# Patient Record
Sex: Female | Born: 1995 | Race: Black or African American | Hispanic: No | Marital: Single | Smoking: Never smoker
Health system: Southern US, Community
[De-identification: ages and names within clinical notes are randomized; demographics above are authoritative.]

## PROBLEM LIST (undated history)

## (undated) ENCOUNTER — Inpatient Hospital Stay (HOSPITAL_COMMUNITY): Payer: Self-pay

## (undated) DIAGNOSIS — Z8679 Personal history of other diseases of the circulatory system: Secondary | ICD-10-CM

## (undated) DIAGNOSIS — Z789 Other specified health status: Secondary | ICD-10-CM

## (undated) DIAGNOSIS — N390 Urinary tract infection, site not specified: Secondary | ICD-10-CM

## (undated) HISTORY — PX: TONSILLECTOMY: SUR1361

---

## 2015-11-21 ENCOUNTER — Encounter (HOSPITAL_COMMUNITY): Payer: Self-pay

## 2015-11-21 ENCOUNTER — Emergency Department (HOSPITAL_COMMUNITY): Payer: Managed Care, Other (non HMO)

## 2015-11-21 ENCOUNTER — Emergency Department (HOSPITAL_COMMUNITY)
Admission: EM | Admit: 2015-11-21 | Discharge: 2015-11-21 | Disposition: A | Discharge status: Home / Self Care | Payer: Managed Care, Other (non HMO) | Attending: Emergency Medicine | Admitting: Emergency Medicine

## 2015-11-21 DIAGNOSIS — Y92481 Parking lot as the place of occurrence of the external cause: Secondary | ICD-10-CM | POA: Diagnosis not present

## 2015-11-21 DIAGNOSIS — F17210 Nicotine dependence, cigarettes, uncomplicated: Secondary | ICD-10-CM | POA: Diagnosis not present

## 2015-11-21 DIAGNOSIS — Y9302 Activity, running: Secondary | ICD-10-CM | POA: Insufficient documentation

## 2015-11-21 DIAGNOSIS — Z23 Encounter for immunization: Secondary | ICD-10-CM | POA: Diagnosis not present

## 2015-11-21 DIAGNOSIS — W228XXA Striking against or struck by other objects, initial encounter: Secondary | ICD-10-CM | POA: Diagnosis not present

## 2015-11-21 DIAGNOSIS — S99922A Unspecified injury of left foot, initial encounter: Secondary | ICD-10-CM | POA: Diagnosis present

## 2015-11-21 DIAGNOSIS — T148XXA Other injury of unspecified body region, initial encounter: Secondary | ICD-10-CM

## 2015-11-21 DIAGNOSIS — S91115A Laceration without foreign body of left lesser toe(s) without damage to nail, initial encounter: Secondary | ICD-10-CM | POA: Diagnosis not present

## 2015-11-21 DIAGNOSIS — Y999 Unspecified external cause status: Secondary | ICD-10-CM | POA: Insufficient documentation

## 2015-11-21 MED ORDER — CIPROFLOXACIN HCL 500 MG PO TABS
500.0000 mg | ORAL_TABLET | Freq: Two times a day (BID) | ORAL | 0 refills | Status: DC
Start: 2015-11-21 — End: 2017-01-25

## 2015-11-21 MED ORDER — CIPROFLOXACIN HCL 500 MG PO TABS
500.0000 mg | ORAL_TABLET | Freq: Once | ORAL | Status: AC
  Administered 2015-11-21: 500 mg via ORAL
  Filled 2015-11-21: qty 1

## 2015-11-21 MED ORDER — TETANUS-DIPHTH-ACELL PERTUSSIS 5-2.5-18.5 LF-MCG/0.5 IM SUSP
0.5000 mL | Freq: Once | INTRAMUSCULAR | Status: AC
  Administered 2015-11-21: 0.5 mL via INTRAMUSCULAR
  Filled 2015-11-21: qty 0.5
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
## 2016-06-04 ENCOUNTER — Emergency Department (HOSPITAL_COMMUNITY): Payer: Medicaid Other

## 2016-06-04 ENCOUNTER — Encounter (HOSPITAL_COMMUNITY): Payer: Self-pay | Admitting: *Deleted

## 2016-06-04 ENCOUNTER — Emergency Department (HOSPITAL_COMMUNITY)
Admission: EM | Admit: 2016-06-04 | Discharge: 2016-06-04 | Disposition: A | Discharge status: Home / Self Care | Payer: Medicaid Other | Attending: Emergency Medicine | Admitting: Emergency Medicine

## 2016-06-04 DIAGNOSIS — F17210 Nicotine dependence, cigarettes, uncomplicated: Secondary | ICD-10-CM | POA: Diagnosis not present

## 2016-06-04 DIAGNOSIS — O99331 Smoking (tobacco) complicating pregnancy, first trimester: Secondary | ICD-10-CM | POA: Insufficient documentation

## 2016-06-04 DIAGNOSIS — O26891 Other specified pregnancy related conditions, first trimester: Secondary | ICD-10-CM | POA: Diagnosis present

## 2016-06-04 DIAGNOSIS — Z3A01 Less than 8 weeks gestation of pregnancy: Secondary | ICD-10-CM

## 2016-06-04 DIAGNOSIS — Z79899 Other long term (current) drug therapy: Secondary | ICD-10-CM | POA: Insufficient documentation

## 2016-06-04 DIAGNOSIS — R1011 Right upper quadrant pain: Secondary | ICD-10-CM

## 2016-06-04 LAB — CBC
HCT: 38.3 % (ref 36.0–46.0)
HEMOGLOBIN: 12.4 g/dL (ref 12.0–15.0)
MCH: 26.7 pg (ref 26.0–34.0)
MCHC: 32.4 g/dL (ref 30.0–36.0)
MCV: 82.5 fL (ref 78.0–100.0)
PLATELETS: 296 10*3/uL (ref 150–400)
RBC: 4.64 MIL/uL (ref 3.87–5.11)
RDW: 14.1 % (ref 11.5–15.5)
WBC: 7.5 10*3/uL (ref 4.0–10.5)

## 2016-06-04 LAB — URINALYSIS, ROUTINE W REFLEX MICROSCOPIC
Bilirubin Urine: NEGATIVE
GLUCOSE, UA: NEGATIVE mg/dL
Ketones, ur: NEGATIVE mg/dL
Leukocytes, UA: NEGATIVE
NITRITE: NEGATIVE
PH: 5 (ref 5.0–8.0)
PROTEIN: NEGATIVE mg/dL
SPECIFIC GRAVITY, URINE: 1.026 (ref 1.005–1.030)

## 2016-06-04 LAB — I-STAT BETA HCG BLOOD, ED (MC, WL, AP ONLY): I-stat hCG, quantitative: >2000 m[IU]/mL — ABNORMAL HIGH (ref <5)

## 2016-06-04 LAB — COMPREHENSIVE METABOLIC PANEL
ALBUMIN: 3.9 g/dL (ref 3.5–5.0)
ALK PHOS: 52 U/L (ref 38–126)
ALT: 14 U/L (ref 14–54)
ANION GAP: 7 (ref 5–15)
AST: 21 U/L (ref 15–41)
BILIRUBIN TOTAL: 0.8 mg/dL (ref 0.3–1.2)
BUN: 7 mg/dL (ref 6–20)
CALCIUM: 9.2 mg/dL (ref 8.9–10.3)
CO2: 24 mmol/L (ref 22–32)
CREATININE: 0.56 mg/dL (ref 0.44–1.00)
Chloride: 102 mmol/L (ref 101–111)
GFR calc Af Amer: >60 mL/min (ref >60)
GFR calc non Af Amer: >60 mL/min (ref >60)
GLUCOSE: 90 mg/dL (ref 65–99)
Potassium: 3.7 mmol/L (ref 3.5–5.1)
SODIUM: 133 mmol/L — AB (ref 135–145)
TOTAL PROTEIN: 7.7 g/dL (ref 6.5–8.1)

## 2016-06-04 LAB — LIPASE, BLOOD: Lipase: 17 U/L (ref 11–51)

## 2016-06-04 MED ORDER — CEPHALEXIN 500 MG PO CAPS: 500.0000 mg | ORAL_CAPSULE | Freq: Two times a day (BID) | ORAL | 0 refills | Status: DC

## 2016-06-04 NOTE — ED Triage Notes (Signed)
Pt reports having positive preg test on Thursday. Having lower abd cramping since before finding out she is pregnant. Denies vaginal bleeding. Reports freq bowel movements and lower back pain.

## 2016-06-04 NOTE — Discharge Instructions (Signed)
You are  6 weeks and 3 days pregnant. Baby has normal heart rate. Your ultrasound of your gallbladder was normal. Please admit fatty and spicy foods. Have given you a gastrointestinal follow-up if symptoms persist. Have given you follow-up to the women's clinic please call tomorrow for an appointment as soon as possible. Continue your prenatal vitamins. Your urine does have a small amount of blood in it. And discharged on antibiotics. Have cultured her urine and if it grows back anything abnormal will be notified. If you develop any cramping, vaginal bleeding, vaginal discharge, fevers, nausea, vomiting please return to the ED immediately or go to the women's ER.

## 2016-06-04 NOTE — ED Notes (Signed)
PT did not have to use RR at this time  

## 2016-06-04 NOTE — ED Notes (Signed)
Pt stable, ambulatory, states understanding of discharge instructions 

## 2016-06-05 NOTE — ED Provider Notes (Signed)
[facility] Provider Note   CSN: [id number] Arrival date & time: 06/04/16  1422     History   Chief Complaint Chief Complaint  Patient presents with  . Abdominal Pain  . Back Pain    HPI Jodi York is a 21 y.o. female.  HPI 21 year old African American female with no significant past medical history presents to the ED today with complaints of right upper quadrant abdominal pain and positive pregnancy test last week. Patient states that she's been no right upper quadrant abdominal pain for the past month. The pain is intermittent and associated when she eats with food. Nothing makes better or worse. She has not tried anything for the pain. Pain self resolved. Denies any nausea, vomiting, diarrhea. She denies any fever or history of abdominal surgeries. The patient states that her bowel movements are regular and she denies any constipation, hematochezia, melena. Patient also states that last week she took a pregnancy test and it was positive. States that her last menstrual period was 04/17/2016. This is her first pregnancy. She complains of low back pain and mild lower abdominal cramping since she thought she was pregnant. She denies any vaginal bleeding. Patient denies any urinary symptoms, fever, chills, lightheadedness, dizziness, chest pain, shortness of breath, urinary retention, loss of bowel or bladder, saddle paresthesias, lower extremity paresthesias. Denies having OB/GYN. States she did start taking prenatal vitamins History reviewed. No pertinent past medical history.  There are no active problems to display for this patient.   History reviewed. No pertinent surgical history.  OB History    Gravida Para Term Preterm AB Living   1             SAB TAB Ectopic Multiple Live Births                   Home Medications    Prior to Admission medications   Medication Sig Start Date End Date Taking? Authorizing Provider  diphenhydrAMINE (BENADRYL) 25 mg capsule Take 25 mg  by mouth every 6 (six) hours as needed for allergies.   Yes Historical Provider, MD  Prenatal Vit-Fe Fumarate-FA (PRENATAL MULTIVITAMIN) TABS tablet Take 1 tablet by mouth 2 (two) times daily.   Yes Historical Provider, MD  cephALEXin (KEFLEX) 500 MG capsule Take 1 capsule (500 mg total) by mouth 2 (two) times daily. 06/04/16   Rise Mu, PA-C  ciprofloxacin (CIPRO) 500 MG tablet Take 1 tablet (500 mg total) by mouth 2 (two) times daily. Patient not taking: Reported on 06/04/2016 11/21/15   April Palumbo, MD    Family History History reviewed. No pertinent family history.  Social History Social History  Substance Use Topics  . Smoking status: Current Every Day Smoker    Types: Cigarettes  . Smokeless tobacco: Current User  . Alcohol use Yes     Allergies   Shellfish allergy; Penicillins; and Pollen extract   Review of Systems Review of Systems  Constitutional: Negative for chills and fever.  HENT: Negative for congestion.   Eyes: Negative for visual disturbance.  Respiratory: Negative for cough and shortness of breath.   Cardiovascular: Negative for chest pain.  Gastrointestinal: Positive for abdominal pain. Negative for constipation, diarrhea, nausea and vomiting.  Genitourinary: Negative for dysuria, flank pain, frequency, hematuria, pelvic pain, vaginal bleeding, vaginal discharge and vaginal pain.  Musculoskeletal: Positive for back pain.  Skin: Negative.   Neurological: Negative for dizziness, syncope, weakness, light-headedness, numbness and headaches.     Physical Exam Updated Vital Signs  BP (!) 101/58 (BP Location: Right Arm)   Pulse 91   Temp 98.7 F (37.1 C) (Oral)   Resp 16   LMP 04/17/2016   SpO2 100%   Physical Exam  Constitutional: She is oriented to person, place, and time. She appears well-developed and well-nourished. No distress.  Nontoxic appearing. Resting comfortable on the bed.  HENT:  Head: Normocephalic and atraumatic.  Mouth/Throat:  Oropharynx is clear and moist.  Eyes: Conjunctivae and EOM are normal. Pupils are equal, round, and reactive to light. Right eye exhibits no discharge. Left eye exhibits no discharge. No scleral icterus.  Neck: Normal range of motion. Neck supple. No thyromegaly present.  Cardiovascular: Normal rate, regular rhythm, normal heart sounds and intact distal pulses.  Exam reveals no gallop and no friction rub.   No murmur heard. Pulmonary/Chest: Effort normal and breath sounds normal.  Abdominal: Soft. Bowel sounds are normal. She exhibits no distension. There is no tenderness. There is no rigidity, no rebound, no guarding and no CVA tenderness.  No CVA tenderness.  Genitourinary:  Genitourinary Comments: Chaperone present for exam. Patient tolerated exam without difficulties. Cervical os is closed. No vaginal bleeding, vaginal discharge, vaginal erythema noted. Cervix is firm. No cervical motion tenderness, adnexal tenderness or fullness.  Musculoskeletal: Normal range of motion.  Lymphadenopathy:    She has no cervical adenopathy.  Neurological: She is alert and oriented to person, place, and time.  Skin: Skin is warm and dry. Capillary refill takes less than 2 seconds.  Nursing note and vitals reviewed.    ED Treatments / Results  Labs (all labs ordered are listed, but only abnormal results are displayed) Labs Reviewed  COMPREHENSIVE METABOLIC PANEL - Abnormal; Notable for the following:       Result Value   Sodium 133 (*)    All other components within normal limits  URINALYSIS, ROUTINE W REFLEX MICROSCOPIC - Abnormal; Notable for the following:    Hgb urine dipstick LARGE (*)    Bacteria, UA RARE (*)    Squamous Epithelial / LPF 0-5 (*)    All other components within normal limits  I-STAT BETA HCG BLOOD, ED ([facility], [facility], [facility] ONLY) - Abnormal; Notable for the following:    I-stat hCG, quantitative >2,000.0 (*)    All other components within normal limits  URINE CULTURE  LIPASE, BLOOD    CBC    EKG  EKG Interpretation None       Radiology US Ob Comp < 14 Wks  Result Date: 06/04/2016 CLINICAL DATA:  Vaginal bleeding. Pregnant with unknown last menstrual period. Quantitative beta HCG greater than 2000. EXAM: OBSTETRIC <14 WK [place] AND TRANSVAGINAL OB US TECHNIQUE: Both transabdominal and transvaginal ultrasound examinations were performed for complete evaluation of the gestation as well as the maternal uterus, adnexal regions, and pelvic cul-de-sac. Transvaginal technique was performed to assess early pregnancy. COMPARISON:  None. FINDINGS: Intrauterine gestational sac: Visualized Yolk sac:  Visualized Embryo:  Visualized Cardiac Activity: Visualized Heart Rate: 171  bpm CRL:  6.2  mm   6 w   3 d                  [place] EDC: 01/25/2017. Subchorionic hemorrhage:  None visualized. Maternal uterus/adnexae: Normal appearing right ovary containing a corpus luteum. Nonvisualized left ovary. No free peritoneal fluid. IMPRESSION: Single live intrauterine gestation with an estimated gestational age of [redacted] weeks and 3 days. No complicating features. Nonvisualized maternal left ovary. Electronically Signed   By: Beckie Salts  M.D.   On: 06/04/2016 19:05   US Ob Transvaginal  Result Date: 06/04/2016 CLINICAL DATA:  Vaginal bleeding. Pregnant with unknown last menstrual period. Quantitative beta HCG greater than 2000. EXAM: OBSTETRIC <14 WK [place] AND TRANSVAGINAL OB US TECHNIQUE: Both transabdominal and transvaginal ultrasound examinations were performed for complete evaluation of the gestation as well as the maternal uterus, adnexal regions, and pelvic cul-de-sac. Transvaginal technique was performed to assess early pregnancy. COMPARISON:  None. FINDINGS: Intrauterine gestational sac: Visualized Yolk sac:  Visualized Embryo:  Visualized Cardiac Activity: Visualized Heart Rate: 171  bpm CRL:  6.2  mm   6 w   3 d                  [place] EDC: 01/25/2017. Subchorionic hemorrhage:  None visualized. Maternal  uterus/adnexae: Normal appearing right ovary containing a corpus luteum. Nonvisualized left ovary. No free peritoneal fluid. IMPRESSION: Single live intrauterine gestation with an estimated gestational age of [redacted] weeks and 3 days. No complicating features. Nonvisualized maternal left ovary. Electronically Signed   By: Beckie Salts M.D.   On: 06/04/2016 19:05   US Abdomen Limited Ruq  Result Date: 06/04/2016 CLINICAL DATA:  Right upper quadrant abdominal pain for the past 2 weeks. EXAM: US ABDOMEN LIMITED - RIGHT UPPER QUADRANT COMPARISON:  None. FINDINGS: Gallbladder: No gallstones or wall thickening visualized. No sonographic Murphy sign noted by sonographer. Common bile duct: Diameter: 2.4 mm Liver: No focal lesion identified. Within normal limits in parenchymal echogenicity. IMPRESSION: Normal examination. Electronically Signed   By: Beckie Salts M.D.   On: 06/04/2016 17:49    Procedures Procedures (including critical care time)  Medications Ordered in ED Medications - No data to display   Initial Impression / Assessment and Plan / ED Course  I have reviewed the triage vital signs and the nursing notes.  Pertinent labs & imaging results that were available during my care of the patient were reviewed by me and considered in my medical decision making (see chart for details).     Patient presents to the ED with complaints of right upper quadrant abdominal pain ongoing for the past month along with a positive pregnancy test last week. Labs are reassuring. No leukocytosis. Electrolytes are normal. Lipase is normal. Patient without any focal abdominal tenderness on exam. No signs of peritonitis. No CVA tenderness. Patient denies any urinary symptoms. UA with large amount of hemoglobin rare bacteria. We'll send for culture. Doubt UTI or [place] however given these findings and the patient's pregnancy we'll start antibiotics. Patient states she does have a penicillin allergy but takes amoxicillin already  difficulties. Discussed with pharmacy will start patient on Keflex. I-STAT beta hCG was greater than 2000. Patient denies any vaginal bleeding. Pelvic exam revealed a closed cervical os without any vaginal bleeding. No cervical motion tenderness. Doubt PID.  Vaginal ultrasound revealed a single live intrauterine gestation with estimated gestational age of [redacted] weeks and 3 days. No Pitting features. Ultrasound right upper quadrant was ordered that showed no cholelithiasis or cholecystitis. No other abnormalities. Doubt cholecystitis. Symptoms consistent with biliary colic. Encourage patient to follow up with GI doctor. Have given patient referral to OB/GYN. The patient is currently taking multivitamin. Vital signs are stable. Low back pain likely due to pregnancy changes. Patient was discussed with Dr. Deretha Emory who is agreeable with the above plan. Pt is hemodynamically stable, in NAD, & able to ambulate in the ED. Pain has been managed & has no complaints prior to dc. Pt  is comfortable with above plan and is stable for discharge at this time. All questions were answered prior to disposition. Strict return precautions for f/u to the ED were discussed.   Final Clinical Impressions(s) / ED Diagnoses   Final diagnoses:  RUQ pain  Less than [redacted] weeks gestation of pregnancy    New Prescriptions Discharge Medication List as of 06/04/2016  8:01 PM    START taking these medications   Details  cephALEXin (KEFLEX) 500 MG capsule Take 1 capsule (500 mg total) by mouth 2 (two) times daily., Starting Sun 06/04/2016, Print         Rise Mu, PA-C 06/05/16 0130    Vanetta Mulders, MD 06/07/16 [phone]

## 2016-06-06 LAB — URINE CULTURE: Culture: NO GROWTH

## 2016-07-17 LAB — OB RESULTS CONSOLE ANTIBODY SCREEN: Antibody Screen: NEGATIVE

## 2016-07-17 LAB — OB RESULTS CONSOLE RPR: RPR: NONREACTIVE

## 2016-07-17 LAB — OB RESULTS CONSOLE ABO/RH: RH Type: POSITIVE

## 2016-07-17 LAB — OB RESULTS CONSOLE HEPATITIS B SURFACE ANTIGEN: Hepatitis B Surface Ag: NEGATIVE

## 2016-07-17 LAB — OB RESULTS CONSOLE RUBELLA ANTIBODY, IGM: RUBELLA: IMMUNE

## 2016-07-17 LAB — OB RESULTS CONSOLE HIV ANTIBODY (ROUTINE TESTING): HIV: NONREACTIVE

## 2016-10-09 ENCOUNTER — Encounter (HOSPITAL_COMMUNITY): Payer: Self-pay | Admitting: *Deleted

## 2016-10-09 ENCOUNTER — Emergency Department (HOSPITAL_COMMUNITY): Payer: Medicaid Other

## 2016-10-09 ENCOUNTER — Emergency Department (HOSPITAL_COMMUNITY)
Admission: EM | Admit: 2016-10-09 | Discharge: 2016-10-09 | Disposition: A | Discharge status: Home / Self Care | Payer: Medicaid Other | Attending: Emergency Medicine | Admitting: Emergency Medicine

## 2016-10-09 DIAGNOSIS — O9989 Other specified diseases and conditions complicating pregnancy, childbirth and the puerperium: Secondary | ICD-10-CM | POA: Diagnosis not present

## 2016-10-09 DIAGNOSIS — F17210 Nicotine dependence, cigarettes, uncomplicated: Secondary | ICD-10-CM | POA: Insufficient documentation

## 2016-10-09 DIAGNOSIS — Z79899 Other long term (current) drug therapy: Secondary | ICD-10-CM | POA: Insufficient documentation

## 2016-10-09 DIAGNOSIS — O99332 Smoking (tobacco) complicating pregnancy, second trimester: Secondary | ICD-10-CM | POA: Diagnosis not present

## 2016-10-09 DIAGNOSIS — R0789 Other chest pain: Secondary | ICD-10-CM | POA: Diagnosis not present

## 2016-10-09 DIAGNOSIS — Z3A24 24 weeks gestation of pregnancy: Secondary | ICD-10-CM | POA: Insufficient documentation

## 2016-10-09 LAB — I-STAT TROPONIN, ED: Troponin i, poc: 0 ng/mL (ref 0.00–0.08)

## 2016-10-09 MED ORDER — ACETAMINOPHEN 500 MG PO TABS: 500.0000 mg | ORAL_TABLET | Freq: Four times a day (QID) | ORAL | 0 refills | Status: DC | PRN

## 2016-10-09 MED ORDER — ACETAMINOPHEN 325 MG PO TABS
650.0000 mg | ORAL_TABLET | Freq: Once | ORAL | Status: AC
  Administered 2016-10-09: 650 mg via ORAL
  Filled 2016-10-09: qty 2

## 2016-10-09 NOTE — ED Provider Notes (Signed)
[facility] Provider Note   CSN: [id number] Arrival date & time: 10/09/16  0117     History   Chief Complaint Chief Complaint  Patient presents with  . Chest Pain    HPI Jodi York is a 21 y.o. female.  HPI  This a 21 year old female currently [redacted] weeks pregnant who presents with two-week history of chest pain. Patient reports anterior pressure-like pain. It is worse when she lies flat at night or leans forward.current pain is 7 out of 10. She denies shortness of breath or fevers. She denies leg swelling or pain. Denies any worsening of pain with food. She has not taken anything for her pain.  She currently has no pregnancy complaints. Reports good fetal movement. No vaginal discharge or bleeding.  History reviewed. No pertinent past medical history.  There are no active problems to display for this patient.   History reviewed. No pertinent surgical history.  OB History    Gravida Para Term Preterm AB Living   1             SAB TAB Ectopic Multiple Live Births                   Home Medications    Prior to Admission medications   Medication Sig Start Date End Date Taking? Authorizing Provider  Prenatal Vit-Fe Fumarate-FA (PRENATAL MULTIVITAMIN) TABS tablet Take 1 tablet by mouth 2 (two) times daily.   Yes [provider]  acetaminophen (TYLENOL) 500 MG tablet Take 1 tablet (500 mg total) by mouth every 6 (six) hours as needed. 10/09/16   Mahrosh Donnell, Mayer Masker, MD  cephALEXin (KEFLEX) 500 MG capsule Take 1 capsule (500 mg total) by mouth 2 (two) times daily. Patient not taking: Reported on 10/09/2016 06/04/16   Demetrios Loll T, PA-C  ciprofloxacin (CIPRO) 500 MG tablet Take 1 tablet (500 mg total) by mouth 2 (two) times daily. Patient not taking: Reported on 06/04/2016 11/21/15   Nicanor Alcon, April, MD    Family History No family history on file.  Social History Social History  Substance Use Topics  . Smoking status: Current Every Day Smoker    Types:  Cigarettes  . Smokeless tobacco: Current User  . Alcohol use Yes     Allergies   Shellfish allergy; Penicillins; and Pollen extract   Review of Systems Review of Systems  Constitutional: Negative for fever.  Respiratory: Negative for cough and shortness of breath.   Cardiovascular: Positive for chest pain. Negative for palpitations and leg swelling.  Gastrointestinal: Negative for abdominal pain.  Genitourinary: Negative for vaginal bleeding and vaginal discharge.  All other systems reviewed and are negative.    Physical Exam Updated Vital Signs BP 125/76   Pulse 69   Temp 98.4 F (36.9 C) (Oral)   Resp 17   Ht 5\' 6"  (1.676 m)   Wt 96.6 kg (213 lb)   LMP 04/17/2016   SpO2 100%   BMI 34.38 kg/m   Physical Exam  Constitutional: She is oriented to person, place, and time. She appears well-developed and well-nourished. No distress.  HENT:  Head: Normocephalic and atraumatic.  Cardiovascular: Normal rate and regular rhythm.   Murmur heard. Pulmonary/Chest: Effort normal and breath sounds normal. No respiratory distress. She has no wheezes. She exhibits no tenderness.  Abdominal: Soft. Bowel sounds are normal. There is no tenderness. There is no guarding.  Gravid above the umbilicus  Musculoskeletal:  No calf swelling or tenderness noted  Neurological: She is alert and  oriented to person, place, and time.  Skin: Skin is warm and dry.  Psychiatric: She has a normal mood and affect.  Nursing note and vitals reviewed.    ED Treatments / Results  Labs (all labs ordered are listed, but only abnormal results are displayed) Labs Reviewed  I-STAT TROPONIN, ED    EKG  EKG Interpretation  Date/Time:  Monday October 09 2016 01:26:24 EDT Ventricular Rate:  81 PR Interval:  142 QRS Duration: 72 QT Interval:  348 QTC Calculation: 404 R Axis:   34 Text Interpretation:  Normal sinus rhythm Normal ECG No old tracing to compare Confirmed by Rochele Raring [phone]) on  10/09/2016 1:29:52 AM       Radiology Dg Chest 2 View  Result Date: 10/09/2016 CLINICAL DATA:  Chest pain. EXAM: CHEST  2 VIEW COMPARISON:  None. FINDINGS: The cardiomediastinal contours are normal. The lungs are clear. Pulmonary vasculature is normal. No consolidation, pleural effusion, or pneumothorax. No acute osseous abnormalities are seen. IMPRESSION: Unremarkable radiographs the chest. Electronically Signed   By: Rubye Oaks M.D.   On: 10/09/2016 04:20    Procedures Procedures (including critical care time)  Medications Ordered in ED Medications  acetaminophen (TYLENOL) tablet 650 mg (650 mg Oral Given 10/09/16 0404)     Initial Impression / Assessment and Plan / ED Course  I have reviewed the triage vital signs and the nursing notes.  Pertinent labs & imaging results that were available during my care of the patient were reviewed by me and considered in my medical decision making (see chart for details).     Patient presents with chest pain. Ongoing for the last 2 weeks. Somewhat positional in nature. Not reproducible on exam. Vital signs notable initially for blood pressure of 136/89. Improved with position change to 115/79. Troponin, EKG, chest x-ray reassuring. While PE is certainly a consideration, patient's O2 sats 100% and heart rate is 69. She has no leg swelling or tenderness.  Would not pursue PE workup at this time. Patient improved with Tylenol. Discussed the workup and differential with the patient.  Patient has AV follow-up on Wednesday. Recommend calling OB later this morning for blood pressure recheck.  Patient was also given precautions regarding worsening of symptoms.  After history, exam, and medical workup I feel the patient has been appropriately medically screened and is safe for discharge home. Pertinent diagnoses were discussed with the patient. Patient was given return precautions.   Final Clinical Impressions(s) / ED Diagnoses   Final diagnoses:    Atypical chest pain    New Prescriptions New Prescriptions   ACETAMINOPHEN (TYLENOL) 500 MG TABLET    Take 1 tablet (500 mg total) by mouth every 6 (six) hours as needed.     Shon Baton, MD 10/09/16 [phone]

## 2016-10-09 NOTE — Discharge Instructions (Signed)
You're seen today for chest pain.Your EKG, troponin, and x-ray are reassuring.  Follow-up with your OB as planned on Wednesday. Take Tylenol as needed for pain. If you develop worsening pain, shortness of breath,increasing heart rate or any new or worsening symptoms she should be reevaluated immediately.  You need to call your OB later this morning for blood pressure recheck. You had one isolated blood pressure that was elevated. This improved with position changes.

## 2016-10-09 NOTE — ED Notes (Signed)
Pt transported to xray 

## 2016-10-09 NOTE — ED Triage Notes (Signed)
The pt is c/o chest pain whenn she lies down at night for 2 weeks.    She is [redacted] weeks pregnant edc dec

## 2016-12-06 LAB — OB RESULTS CONSOLE GC/CHLAMYDIA
Chlamydia: NEGATIVE
Gonorrhea: NEGATIVE

## 2017-01-01 LAB — OB RESULTS CONSOLE GBS: STREP GROUP B AG: NEGATIVE

## 2017-01-25 ENCOUNTER — Other Ambulatory Visit: Payer: Self-pay

## 2017-01-25 ENCOUNTER — Inpatient Hospital Stay (HOSPITAL_COMMUNITY)
Admission: AD | Admit: 2017-01-25 | Discharge: 2017-01-25 | Disposition: A | Discharge status: Home / Self Care | Payer: Medicaid Other | Source: Ambulatory Visit | Attending: Family Medicine | Admitting: Family Medicine

## 2017-01-25 ENCOUNTER — Telehealth (HOSPITAL_COMMUNITY): Payer: Self-pay | Admitting: *Deleted

## 2017-01-25 ENCOUNTER — Encounter (HOSPITAL_COMMUNITY): Payer: Self-pay | Admitting: *Deleted

## 2017-01-25 DIAGNOSIS — Z3A40 40 weeks gestation of pregnancy: Secondary | ICD-10-CM | POA: Insufficient documentation

## 2017-01-25 DIAGNOSIS — O479 False labor, unspecified: Secondary | ICD-10-CM

## 2017-01-25 DIAGNOSIS — O26893 Other specified pregnancy related conditions, third trimester: Secondary | ICD-10-CM | POA: Insufficient documentation

## 2017-01-25 HISTORY — DX: Other specified health status: Z78.9

## 2017-01-25 NOTE — Discharge Instructions (Signed)
Braxton Hicks Contractions °Contractions of the uterus can occur throughout pregnancy, but they are not always a sign that you are in labor. You may have practice contractions called Braxton Hicks contractions. These false labor contractions are sometimes confused with true labor. °What are Braxton Hicks contractions? °Braxton Hicks contractions are tightening movements that occur in the muscles of the uterus before labor. Unlike true labor contractions, these contractions do not result in opening (dilation) and thinning of the cervix. Toward the end of pregnancy (32-34 weeks), Braxton Hicks contractions can happen more often and may become stronger. These contractions are sometimes difficult to tell apart from true labor because they can be very uncomfortable. You should not feel embarrassed if you go to the hospital with false labor. °Sometimes, the only way to tell if you are in true labor is for your health care provider to look for changes in the cervix. The health care provider will do a physical exam and may monitor your contractions. If you are not in true labor, the exam should show that your cervix is not dilating and your water has not broken. °If there are no prenatal problems or other health problems associated with your pregnancy, it is completely safe for you to be sent home with false labor. You may continue to have Braxton Hicks contractions until you go into true labor. °How can I tell the difference between true labor and false labor? °· Differences °? False labor °? Contractions last 30-70 seconds.: Contractions are usually shorter and not as strong as true labor contractions. °? Contractions become very regular.: Contractions are usually irregular. °? Discomfort is usually felt in the top of the uterus, and it spreads to the lower abdomen and low back.: Contractions are often felt in the front of the lower abdomen and in the groin. °? Contractions do not go away with walking.: Contractions may  go away when you walk around or change positions while lying down. °? Contractions usually become more intense and increase in frequency.: Contractions get weaker and are shorter-lasting as time goes on. °? The cervix dilates and gets thinner.: The cervix usually does not dilate or become thin. °Follow these instructions at home: °· Take over-the-counter and prescription medicines only as told by your health care provider. °· Keep up with your usual exercises and follow other instructions from your health care provider. °· Eat and drink lightly if you think you are going into labor. °· If Braxton Hicks contractions are making you uncomfortable: °? Change your position from lying down or resting to walking, or change from walking to resting. °? Sit and rest in a tub of warm water. °? Drink enough fluid to keep your urine clear or pale yellow. Dehydration may cause these contractions. °? Do slow and deep breathing several times an hour. °· Keep all follow-up prenatal visits as told by your health care provider. This is important. °Contact a health care provider if: °· You have a fever. °· You have continuous pain in your abdomen. °Get help right away if: °· Your contractions become stronger, more regular, and closer together. °· You have fluid leaking or gushing from your vagina. °· You pass blood-tinged mucus (bloody show). °· You have bleeding from your vagina. °· You have low back pain that you never had before. °· You feel your baby’s head pushing down and causing pelvic pressure. °· Your baby is not moving inside you as much as it used to. °Summary °· Contractions that occur before labor are   called Braxton Hicks contractions, false labor, or practice contractions. °· Braxton Hicks contractions are usually shorter, weaker, farther apart, and less regular than true labor contractions. True labor contractions usually become progressively stronger and regular and they become more frequent. °· Manage discomfort from  Braxton Hicks contractions by changing position, resting in a warm bath, drinking plenty of water, or practicing deep breathing. °This information is not intended to replace advice given to you by your health care provider. Make sure you discuss any questions you have with your health care provider. °Document Released: 01/30/2005 Document Revised: 12/20/2015 Document Reviewed: 12/20/2015 °Elsevier Interactive Patient Education © 2017 [employer] ° ° °Fetal Movement Counts °Patient Name: ________________________________________________ Patient Due Date: ____________________ °What is a fetal movement count? °A fetal movement count is the number of times that you feel your baby move during a certain amount of time. This may also be called a fetal kick count. A fetal movement count is recommended for every pregnant woman. You may be asked to start counting fetal movements as early as week 28 of your pregnancy. °Pay attention to when your baby is most active. You may notice your baby's sleep and wake cycles. You may also notice things that make your baby move more. You should do a fetal movement count: °· When your baby is normally most active. °· At the same time each day. ° °A good time to count movements is while you are resting, after having something to eat and drink. °How do I count fetal movements? °1. Find a quiet, comfortable area. Sit, or lie down on your side. °2. Write down the date, the start time and stop time, and the number of movements that you felt between those two times. Take this information with you to your health care visits. °3. For 2 hours, count kicks, flutters, swishes, rolls, and jabs. You should feel at least 10 movements during 2 hours. °4. You may stop counting after you have felt 10 movements. °5. If you do not feel 10 movements in 2 hours, have something to eat and drink. Then, keep resting and counting for 1 hour. If you feel at least 4 movements during that hour, you may stop  counting. °Contact a health care provider if: °· You feel fewer than 4 movements in 2 hours. °· Your baby is not moving like he or she usually does. °Date: ____________ Start time: ____________ Stop time: ____________ Movements: ____________ °Date: ____________ Start time: ____________ Stop time: ____________ Movements: ____________ °Date: ____________ Start time: ____________ Stop time: ____________ Movements: ____________ °Date: ____________ Start time: ____________ Stop time: ____________ Movements: ____________ °Date: ____________ Start time: ____________ Stop time: ____________ Movements: ____________ °Date: ____________ Start time: ____________ Stop time: ____________ Movements: ____________ °Date: ____________ Start time: ____________ Stop time: ____________ Movements: ____________ °Date: ____________ Start time: ____________ Stop time: ____________ Movements: ____________ °Date: ____________ Start time: ____________ Stop time: ____________ Movements: ____________ °This information is not intended to replace advice given to you by your health care provider. Make sure you discuss any questions you have with your health care provider. °Document Released: 03/01/2006 Document Revised: 09/29/2015 Document Reviewed: 03/11/2015 °Elsevier Interactive Patient Education © 2018 [employer] Inc[employer] ° °

## 2017-01-25 NOTE — MAU Note (Signed)
Pt presents with c/o lower back pain that began last night.  Reports woke up this morning with pain in the right lower abdomen.  Denies LOF.  Reports bloody vaginal discharge.  Reports +FM.

## 2017-01-25 NOTE — MAU Note (Signed)
Pt reports having back pain since last night. C/o lower abd pain and cramping since this morning q 5-10 min now more like 10-15 minutes "very painful". reports some bloody show.

## 2017-01-25 NOTE — Telephone Encounter (Signed)
Preadmission screen  

## 2017-01-30 ENCOUNTER — Other Ambulatory Visit: Payer: Self-pay | Admitting: Family Medicine

## 2017-02-03 ENCOUNTER — Inpatient Hospital Stay (HOSPITAL_COMMUNITY): Admission: RE | Admit: 2017-02-03 | Payer: Medicaid Other | Source: Ambulatory Visit

## 2017-02-03 NOTE — Progress Notes (Signed)
Called patient to inquire about her IOL. Pt reports that [facility] and that both Mom and Baby are doing well.

## 2017-03-29 ENCOUNTER — Encounter (HOSPITAL_COMMUNITY): Payer: Self-pay | Admitting: Emergency Medicine

## 2017-03-29 ENCOUNTER — Ambulatory Visit (HOSPITAL_COMMUNITY)
Admission: EM | Admit: 2017-03-29 | Discharge: 2017-03-29 | Disposition: A | Discharge status: Home / Self Care | Payer: Medicaid Other | Attending: Internal Medicine | Admitting: Internal Medicine

## 2017-03-29 DIAGNOSIS — M25552 Pain in left hip: Secondary | ICD-10-CM

## 2017-03-29 NOTE — ED Triage Notes (Signed)
PT C/O: constant left hip pain since giving birth to child on 01/29/17.... Reports pain radiates down her LLE  Wants to make sure its not a blood clot  DENIES: inj/trauma   TAKING MEDS: none   A&O x4... NAD... Ambulatory

## 2017-03-29 NOTE — ED Provider Notes (Signed)
[facility]    CSN: [id number] Arrival date & time: 03/29/17  1032     History   Chief Complaint Chief Complaint  Patient presents with  . Leg Pain    HPI Jodi York is a 22 y.o. female no significant past medical history presenting today with left leg/hip pain. Her pain has been going on for approximately 1 month.  She has difficulty standing, has pain with most movements.  States today she had a sensation of instability while in class and states she almost fell.  Patient denies radiation into the leg, denies numbness or tingling.  Patient is approximately 2 months postpartum from vaginal delivery.  No complications from birth.  Patient states she had a very similar pain a few years ago and saw orthopedics for this.  At that time they told her that she needed to exercise more.  She denies any specific injury eliciting this pain this time.  Patient is concerned about blood clot.  She denies any lower leg swelling, calf pain, redness, skin changes.  She also denies redness, skin changes, swelling to the area of pain.  Patient has not tried anything for pain.  HPI  Past Medical History:  Diagnosis Date  . Medical history non-contributory     There are no active problems to display for this patient.   Past Surgical History:  Procedure Laterality Date  . TONSILLECTOMY      OB History    Gravida Para Term Preterm AB Living   1             SAB TAB Ectopic Multiple Live Births                   Home Medications    Prior to Admission medications   Medication Sig Start Date End Date Taking? Authorizing Provider  Prenatal Vit-Fe Fumarate-FA (PRENATAL MULTIVITAMIN) TABS tablet Take 1 tablet by mouth 2 (two) times daily.   Yes [provider]  acetaminophen (TYLENOL) 500 MG tablet Take 1 tablet (500 mg total) by mouth every 6 (six) hours as needed. 10/09/16   Horton, Mayer Masker, MD    Family History Family History  Problem Relation Age of Onset  .  Hypertension Father     Social History Social History   Tobacco Use  . Smoking status: Never Smoker  . Smokeless tobacco: Never Used  Substance Use Topics  . Alcohol use: No    Frequency: Never  . Drug use: No     Allergies   Shellfish allergy; Penicillins; and Pollen extract   Review of Systems Review of Systems  Constitutional: Negative for fatigue and fever.  Respiratory: Negative for cough, chest tightness and shortness of breath.   Cardiovascular: Negative for chest pain and leg swelling.  Gastrointestinal: Negative for abdominal pain, nausea and vomiting.  Genitourinary: Negative for difficulty urinating.  Musculoskeletal: Positive for arthralgias, gait problem and myalgias. Negative for back pain, joint swelling, neck pain and neck stiffness.  Skin: Negative for color change and pallor.  Neurological: Negative for dizziness, light-headedness and headaches.     Physical Exam Triage Vital Signs ED Triage Vitals [03/29/17 1110]  Enc Vitals Group     BP 123/78     Pulse Rate 86     Resp 16     Temp 98.3 F (36.8 C)     Temp Source Oral     SpO2 100 %     Weight      Height  Head Circumference      Peak Flow      Pain Score      Pain Loc      Pain Edu?      Excl. in GC?    No data found.  Updated Vital Signs BP 123/78 (BP Location: Left Arm)   Pulse 86   Temp 98.3 F (36.8 C) (Oral)   Resp 16   LMP 03/15/2017   SpO2 100%   Breastfeeding? No   Visual Acuity Right Eye Distance:   Left Eye Distance:   Bilateral Distance:    Right Eye Near:   Left Eye Near:    Bilateral Near:     Physical Exam  Constitutional: She appears well-developed and well-nourished. No distress.  HENT:  Head: Normocephalic and atraumatic.  Eyes: Conjunctivae are normal.  Neck: Neck supple.  Cardiovascular: Normal rate and regular rhythm.  No murmur heard. Pulmonary/Chest: Effort normal and breath sounds normal. No respiratory distress.  Clear to auscultation  bilaterally  Abdominal: Soft. There is no tenderness.  Musculoskeletal: She exhibits no edema, tenderness or deformity.  Patient pointing to lateral aspect of left hip, upper leg as area of pain.  Minimal swelling compared to right.  No obvious deformity.  Nontender to palpation.  No overlying erythema or induration.  Strength 5 out of 5 of left hip compared to right.  Patient has increased pain with resisted hip flexion, pain also with hip abduction and adduction although less than with flexion.  No calf tenderness or erythema.  Nontender to palpation of lumbar spine, lumbar musculature.  Neurological: She is alert.  Skin: Skin is warm and dry.  Psychiatric: She has a normal mood and affect.  Nursing note and vitals reviewed.    UC Treatments / Results  Labs (all labs ordered are listed, but only abnormal results are displayed) Labs Reviewed - No data to display  EKG  EKG Interpretation None       Radiology No results found.  Procedures Procedures (including critical care time)  Medications Ordered in UC Medications - No data to display   Initial Impression / Assessment and Plan / UC Course  I have reviewed the triage vital signs and the nursing notes.  Pertinent labs & imaging results that were available during my care of the patient were reviewed by me and considered in my medical decision making (see chart for details).     Hip/upper leg pain without injury.  Blood clot seems less likely given location, no overlying skin changes, negative risk factors except for oral contraceptives.  Given patient has had very similar pain in past we will treat conservatively.  Will provide crutches to help reduce weightbearing, advised NSAIDs and Tylenol for pain, ice and heating pad.  Follow-up if pain worsening or not improving in 1-2 weeks.  Discussed signs of DVT and return upon these as well. Discussed strict return precautions. Patient verbalized understanding and is agreeable  with plan.   Final Clinical Impressions(s) / UC Diagnoses   Final diagnoses:  Left hip pain    ED Discharge Orders    None       Controlled Substance Prescriptions [place] Controlled Substance Registry consulted? Not Applicable   Lew DawesWieters, Aziz Slape C, [place]PA-C 03/30/17 [phone]

## 2017-03-29 NOTE — Discharge Instructions (Signed)
Use anti-inflammatories for pain/swelling. You may take up to 800 mg Ibuprofen every 8 hours with food. You may supplement Ibuprofen with Tylenol [id number] mg every 8 hours.   You may use 1 or 2 crutch to help with weightbearing.   Please return in 1-2 weeks if you are not having any improvement in your pain.  These return sooner if you have worsening pain, develop radiation into the leg, develop any redness or swelling, or calf pain.

## 2017-03-29 NOTE — ED Notes (Signed)
Crutches by taylor moon, cma

## 2018-09-27 ENCOUNTER — Other Ambulatory Visit: Payer: Self-pay

## 2018-09-27 DIAGNOSIS — Z20822 Contact with and (suspected) exposure to covid-19: Secondary | ICD-10-CM

## 2018-09-29 LAB — NOVEL CORONAVIRUS, NAA: SARS-CoV-2, NAA: NOT DETECTED

## 2018-12-22 ENCOUNTER — Other Ambulatory Visit: Payer: Self-pay

## 2018-12-22 DIAGNOSIS — Z20822 Contact with and (suspected) exposure to covid-19: Secondary | ICD-10-CM

## 2018-12-23 LAB — NOVEL CORONAVIRUS, NAA: SARS-CoV-2, NAA: NOT DETECTED

## 2019-04-13 ENCOUNTER — Emergency Department (HOSPITAL_COMMUNITY): Payer: Medicaid Other

## 2019-04-13 ENCOUNTER — Encounter (HOSPITAL_COMMUNITY): Payer: Self-pay | Admitting: Emergency Medicine

## 2019-04-13 ENCOUNTER — Emergency Department (HOSPITAL_COMMUNITY)
Admission: EM | Admit: 2019-04-13 | Discharge: 2019-04-13 | Disposition: A | Discharge status: Home / Self Care | Payer: Medicaid Other | Attending: Emergency Medicine | Admitting: Emergency Medicine

## 2019-04-13 ENCOUNTER — Other Ambulatory Visit: Payer: Self-pay

## 2019-04-13 DIAGNOSIS — Z79899 Other long term (current) drug therapy: Secondary | ICD-10-CM | POA: Insufficient documentation

## 2019-04-13 DIAGNOSIS — M25552 Pain in left hip: Secondary | ICD-10-CM | POA: Diagnosis present

## 2019-04-13 LAB — POC URINE PREG, ED: Preg Test, Ur: NEGATIVE

## 2019-04-13 MED ORDER — METHOCARBAMOL 750 MG PO TABS: 750.0000 mg | ORAL_TABLET | Freq: Three times a day (TID) | ORAL | 0 refills | Status: DC | PRN

## 2019-04-13 MED ORDER — IBUPROFEN 200 MG PO TABS
400.0000 mg | ORAL_TABLET | Freq: Once | ORAL | Status: AC
  Administered 2019-04-13: 11:00:00 400 mg via ORAL
  Filled 2019-04-13: qty 2

## 2019-04-13 NOTE — ED Triage Notes (Addendum)
Pt reports that she has rotating leg pains that has been ongoing for years. Reports having trouble sitting down to use the restroom. Denies any falls or injuries. Reports her mother thinks she needs an MRI and work up for MS.

## 2019-04-13 NOTE — ED Provider Notes (Signed)
[facility] DEPT Provider Note   CSN: [id number] Arrival date & time: 04/13/19  1007     History Chief Complaint  Patient presents with  . Leg Pain    Jodi York is a 24 y.o. female.  Patient c/o pain to left hip and buttock area. States occurs intermittently for the past couple years. Left hip pain laterally, superiorly, and left sciatic notch area pain. Denies specific strain or injury to area. Currently pain dull, moderate, non radiating, worse w certain movements, position changes, walking, bending at waist. No radicular pain down leg. No numbness/weakness. No saddle area numbness. No problems w normal bowel or bladder control. No fever or chills.   The history is provided by the patient.  Leg Pain Associated symptoms: no fever and no neck pain        Past Medical History:  Diagnosis Date  . Medical history non-contributory     There are no problems to display for this patient.   Past Surgical History:  Procedure Laterality Date  . TONSILLECTOMY       OB History    Gravida  1   Para      Term      Preterm      AB      Living        SAB      TAB      Ectopic      Multiple      Live Births              Family History  Problem Relation Age of Onset  . Hypertension Father     Social History   Tobacco Use  . Smoking status: Never Smoker  . Smokeless tobacco: Never Used  Substance Use Topics  . Alcohol use: No  . Drug use: No    Home Medications Prior to Admission medications   Medication Sig Start Date End Date Taking? Authorizing Provider  acetaminophen (TYLENOL) 500 MG tablet Take 1 tablet (500 mg total) by mouth every 6 (six) hours as needed. 10/09/16   Horton, Barbette Hair, MD  Prenatal Vit-Fe Fumarate-FA (PRENATAL MULTIVITAMIN) TABS tablet Take 1 tablet by mouth 2 (two) times daily.    [provider]    Allergies    Shellfish allergy, Penicillins, and Pollen extract  Review of Systems     Review of Systems  Constitutional: Negative for fever.  HENT: Negative for sore throat.   Eyes: Negative for redness.  Respiratory: Negative for shortness of breath.   Cardiovascular: Negative for chest pain.  Gastrointestinal: Negative for abdominal pain.  Genitourinary: Negative for flank pain.  Musculoskeletal: Negative for neck pain.  Skin: Negative for rash.  Neurological: Negative for weakness and numbness.  Hematological: Does not bruise/bleed easily.  Psychiatric/Behavioral: Negative for confusion.    Physical Exam Updated Vital Signs BP 124/88   Pulse (!) 107   Temp 98.2 F (36.8 C) (Oral)   Resp 17   SpO2 100%   Physical Exam Vitals and nursing note reviewed.  Constitutional:      Appearance: Normal appearance. She is well-developed.  HENT:     Head: Atraumatic.     Nose: Nose normal.     Mouth/Throat:     Mouth: Mucous membranes are moist.  Eyes:     General: No scleral icterus.    Conjunctiva/sclera: Conjunctivae normal.  Neck:     Trachea: No tracheal deviation.  Cardiovascular:     Rate and Rhythm: Normal  rate.     Pulses: Normal pulses.  Pulmonary:     Effort: Pulmonary effort is normal. No respiratory distress.  Abdominal:     General: There is no distension.     Palpations: Abdomen is soft.     Tenderness: There is no abdominal tenderness.  Genitourinary:    Comments: No cva tenderness.  Musculoskeletal:        General: No swelling.     Cervical back: Normal range of motion. No muscular tenderness.     Comments: Lumbar, left lumbar, and sciatic notch area tenderness. Also tenderness left hip. Some pain w active rom left hip. w passive rom, no severe pain. No pain w rom at knee. No LLE swelling. No skin lesions or erythema. Distal pulses palp.   Skin:    General: Skin is warm and dry.     Findings: No rash.  Neurological:     Mental Status: She is alert.     Comments: Alert, speech normal. LLE motor intact, stre 5/5. sens grossly intact.  Steady gait.,   Psychiatric:        Mood and Affect: Mood normal.     ED Results / Procedures / Treatments   Labs (all labs ordered are listed, but only abnormal results are displayed) Results for orders placed or performed during the hospital encounter of 04/13/19  POC Urine Pregnancy, ED (not at [facility])  Result Value Ref Range   Preg Test, Ur NEGATIVE NEGATIVE   DG Lumbar Spine Complete  Result Date: 04/13/2019 CLINICAL DATA:  24 year old female with increasing chronic low back pain. EXAM: LUMBAR SPINE - COMPLETE 4+ VIEW COMPARISON:  None. FINDINGS: There is no evidence of lumbar spine fracture. Alignment is normal. Intervertebral disc spaces are maintained. IMPRESSION: Negative. Electronically Signed   By: Harmon Pier M.D.   On: 04/13/2019 11:29   DG HIP UNILAT W OR W/O PELVIS 2-3 VIEWS LEFT  Result Date: 04/13/2019 CLINICAL DATA:  Pain EXAM: DG HIP (WITH OR WITHOUT PELVIS) 2-3V LEFT COMPARISON:  None. FINDINGS: There is no evidence of hip fracture or dislocation. There is no evidence of arthropathy or other focal bone abnormality. IMPRESSION: No fracture or dislocation of the left hip. Electronically Signed   By: Lauralyn Primes M.D.   On: 04/13/2019 11:27    EKG None  Radiology DG Lumbar Spine Complete  Result Date: 04/13/2019 CLINICAL DATA:  24 year old female with increasing chronic low back pain. EXAM: LUMBAR SPINE - COMPLETE 4+ VIEW COMPARISON:  None. FINDINGS: There is no evidence of lumbar spine fracture. Alignment is normal. Intervertebral disc spaces are maintained. IMPRESSION: Negative. Electronically Signed   By: Harmon Pier M.D.   On: 04/13/2019 11:29   DG HIP UNILAT W OR W/O PELVIS 2-3 VIEWS LEFT  Result Date: 04/13/2019 CLINICAL DATA:  Pain EXAM: DG HIP (WITH OR WITHOUT PELVIS) 2-3V LEFT COMPARISON:  None. FINDINGS: There is no evidence of hip fracture or dislocation. There is no evidence of arthropathy or other focal bone abnormality. IMPRESSION: No fracture or  dislocation of the left hip. Electronically Signed   By: Lauralyn Primes M.D.   On: 04/13/2019 11:27    Procedures Procedures (including critical care time)  Medications Ordered in ED Medications - No data to display  ED Course  I have reviewed the triage vital signs and the nursing notes.  Pertinent labs & imaging results that were available during my care of the patient were reviewed by me and considered in my medical decision making (see  chart for details).    MDM Rules/Calculators/A&P                      Imaging ordered.   Reviewed nursing notes and prior charts for additional history.   No meds taken today. Ibuprofen po.   Labs reviewed/interpreted by me - u preg neg.   Xrays reviewed/interpreted by me - no fx.  Discussed w pt. HR is 84, rr 14. No distress.   Rx Robaxin for home.   Rec pcp f/u.  Return precautions provided.     Final Clinical Impression(s) / ED Diagnoses Final diagnoses:  None    Rx / DC Orders ED Discharge Orders    None       Cathren Laine, MD 04/13/19 1144

## 2019-04-13 NOTE — Discharge Instructions (Signed)
It was our pleasure to provide your ER care today - we hope that you feel better.  Take ibuprofen or naprosyn as need for pain.   You may also take robaxin as need for muscle pain/spasm - no driving when taking.   Follow up with primary care doctor in the next 1-2 weeks.  Return to ER if worse, new symptoms, fevers, leg swelling, numbness/weakness, or other concern.

## 2019-06-01 ENCOUNTER — Encounter (HOSPITAL_COMMUNITY): Payer: Self-pay | Admitting: Emergency Medicine

## 2019-06-01 ENCOUNTER — Emergency Department (HOSPITAL_COMMUNITY)
Admission: EM | Admit: 2019-06-01 | Discharge: 2019-06-01 | Disposition: A | Discharge status: Home / Self Care | Payer: BC Managed Care – PPO | Attending: Emergency Medicine | Admitting: Emergency Medicine

## 2019-06-01 ENCOUNTER — Other Ambulatory Visit: Payer: Self-pay

## 2019-06-01 ENCOUNTER — Emergency Department (HOSPITAL_COMMUNITY): Payer: BC Managed Care – PPO

## 2019-06-01 DIAGNOSIS — R05 Cough: Secondary | ICD-10-CM | POA: Insufficient documentation

## 2019-06-01 DIAGNOSIS — Z20822 Contact with and (suspected) exposure to covid-19: Secondary | ICD-10-CM | POA: Diagnosis not present

## 2019-06-01 DIAGNOSIS — R0989 Other specified symptoms and signs involving the circulatory and respiratory systems: Secondary | ICD-10-CM | POA: Diagnosis not present

## 2019-06-01 DIAGNOSIS — R059 Cough, unspecified: Secondary | ICD-10-CM

## 2019-06-01 LAB — BASIC METABOLIC PANEL
Anion gap: 9 (ref 5–15)
BUN: 11 mg/dL (ref 6–20)
CO2: 26 mmol/L (ref 22–32)
Calcium: 9.1 mg/dL (ref 8.9–10.3)
Chloride: 102 mmol/L (ref 98–111)
Creatinine, Ser: 0.53 mg/dL (ref 0.44–1.00)
GFR calc Af Amer: >60 mL/min (ref >60)
GFR calc non Af Amer: >60 mL/min (ref >60)
Glucose, Bld: 93 mg/dL (ref 70–99)
Potassium: 3.8 mmol/L (ref 3.5–5.1)
Sodium: 137 mmol/L (ref 135–145)

## 2019-06-01 LAB — CBC WITH DIFFERENTIAL/PLATELET
Abs Immature Granulocytes: 0.02 10*3/uL (ref 0.00–0.07)
Basophils Absolute: 0.1 10*3/uL (ref 0.0–0.1)
Basophils Relative: 1 %
Eosinophils Absolute: 0.3 10*3/uL (ref 0.0–0.5)
Eosinophils Relative: 4 %
HCT: 39.3 % (ref 36.0–46.0)
Hemoglobin: 12.3 g/dL (ref 12.0–15.0)
Immature Granulocytes: 0 %
Lymphocytes Relative: 34 %
Lymphs Abs: 2.4 10*3/uL (ref 0.7–4.0)
MCH: 27.3 pg (ref 26.0–34.0)
MCHC: 31.3 g/dL (ref 30.0–36.0)
MCV: 87.1 fL (ref 80.0–100.0)
Monocytes Absolute: 0.6 10*3/uL (ref 0.1–1.0)
Monocytes Relative: 8 %
Neutro Abs: 3.9 10*3/uL (ref 1.7–7.7)
Neutrophils Relative %: 53 %
Platelets: 292 10*3/uL (ref 150–400)
RBC: 4.51 MIL/uL (ref 3.87–5.11)
RDW: 13.8 % (ref 11.5–15.5)
WBC: 7.2 10*3/uL (ref 4.0–10.5)
nRBC: 0 % (ref 0.0–0.2)

## 2019-06-01 LAB — BRAIN NATRIURETIC PEPTIDE: B Natriuretic Peptide: 29.1 pg/mL (ref 0.0–100.0)

## 2019-06-01 LAB — POC URINE PREG, ED: Preg Test, Ur: NEGATIVE

## 2019-06-01 MED ORDER — FUROSEMIDE 20 MG PO TABS: 20.0000 mg | ORAL_TABLET | Freq: Every day | ORAL | 0 refills | Status: DC

## 2019-06-01 MED ORDER — BENZONATATE 100 MG PO CAPS
200.0000 mg | ORAL_CAPSULE | Freq: Once | ORAL | Status: AC
  Administered 2019-06-01: 200 mg via ORAL
  Filled 2019-06-01: qty 2

## 2019-06-01 MED ORDER — LIDOCAINE VISCOUS HCL 2 % MT SOLN
15.0000 mL | Freq: Once | OROMUCOSAL | Status: AC
  Administered 2019-06-01: 12:00:00 15 mL via OROMUCOSAL
  Filled 2019-06-01: qty 15

## 2019-06-01 NOTE — ED Provider Notes (Signed)
[facility] DEPT Provider Note   CSN: [id number] Arrival date & time: 06/01/19  3716     History Chief Complaint  Patient presents with  . Cough    Jodi York is a 24 y.o. female presents to emergency room today with chief complaint of productive cough x1 week.  She is endorsing yellow phlegm.  She has been trying over-the-counter allergy medications without any symptom relief.  She states her chest feels sore after coughing foods and denies any other chest pain.  She has seasonal allergies and typically the medication helps her symptoms.  She has mild nasal congestion and sore throat from coughing.  She denies any sick contacts.  Denies any fever, chills, sinus pressure, hemoptysis, wheezing, abdominal pain, urinary symptoms, diarrhea, loss of sense of taste or smell.  She denies any sick contacts. She denies history of tobacco abuse.  History provided by patient with additional history obtained from chart review.       Past Medical History:  Diagnosis Date  . Medical history non-contributory     There are no problems to display for this patient.   Past Surgical History:  Procedure Laterality Date  . TONSILLECTOMY       OB History    Gravida  1   Para      Term      Preterm      AB      Living        SAB      TAB      Ectopic      Multiple      Live Births              Family History  Problem Relation Age of Onset  . Hypertension Father     Social History   Tobacco Use  . Smoking status: Never Smoker  . Smokeless tobacco: Never Used  Substance Use Topics  . Alcohol use: No  . Drug use: No    Home Medications Prior to Admission medications   Medication Sig Start Date End Date Taking? Authorizing Provider  acetaminophen (TYLENOL) 500 MG tablet Take 1 tablet (500 mg total) by mouth every 6 (six) hours as needed. 10/09/16   Horton, Barbette Hair, MD  furosemide (LASIX) 20 MG tablet Take 1 tablet (20 mg total) by  mouth daily for 5 days. 06/01/19 06/06/19  [place] Glorioso E, PA-C  methocarbamol (ROBAXIN) 750 MG tablet Take 1 tablet (750 mg total) by mouth 3 (three) times daily as needed (muscle spasm/pain). 04/13/19   Lajean Saver, MD  Prenatal Vit-Fe Fumarate-FA (PRENATAL MULTIVITAMIN) TABS tablet Take 1 tablet by mouth 2 (two) times daily.    [provider]    Allergies    Shellfish allergy, Penicillins, and Pollen extract  Review of Systems   Review of Systems  All other systems are reviewed and are negative for acute change except as noted in the HPI.   Physical Exam Updated Vital Signs BP 139/76 (BP Location: Right Arm)   Pulse 70   Temp 98.1 F (36.7 C) (Oral)   Resp 16   LMP 05/15/2019   SpO2 100%   Physical Exam Vitals and nursing note reviewed.  Constitutional:      Appearance: She is well-developed. She is not ill-appearing or toxic-appearing.  HENT:     Head: Normocephalic and atraumatic.     Comments: No sinus or temporal tenderness.    Nose: Congestion present.     Mouth/Throat:  Mouth: Mucous membranes are moist.     Comments: Minor erythema to oropharynx, no edema, no exudate, no tonsillar swelling, voice normal, neck supple without lymphadenopathy Eyes:     General: No scleral icterus.       Right eye: No discharge.        Left eye: No discharge.     Conjunctiva/sclera: Conjunctivae normal.  Neck:     Vascular: No JVD.  Cardiovascular:     Rate and Rhythm: Normal rate and regular rhythm.     Pulses: Normal pulses.     Heart sounds: Normal heart sounds.  Pulmonary:     Effort: Pulmonary effort is normal.     Breath sounds: Normal breath sounds.  Abdominal:     General: There is no distension.     Palpations: Abdomen is soft.  Musculoskeletal:        General: Normal range of motion.     Cervical back: Normal range of motion.     Right lower leg: No edema.     Left lower leg: No edema.  Skin:    General: Skin is warm and dry.     Findings: No  rash.  Neurological:     Mental Status: She is oriented to person, place, and time.     GCS: GCS eye subscore is 4. GCS verbal subscore is 5. GCS motor subscore is 6.     Comments: Fluent speech, no facial droop.  Psychiatric:        Behavior: Behavior normal.      ED Results / Procedures / Treatments   Labs (all labs ordered are listed, but only abnormal results are displayed) Labs Reviewed  SARS CORONAVIRUS 2 (TAT 6-24 HRS)  BASIC METABOLIC PANEL  CBC WITH DIFFERENTIAL/PLATELET  BRAIN NATRIURETIC PEPTIDE  POC URINE PREG, ED    EKG EKG Interpretation  Date/Time:  Sunday June 01 2019 12:46:20 EDT Ventricular Rate:  79 PR Interval:    QRS Duration: 88 QT Interval:  372 QTC Calculation: 427 R Axis:   47 Text Interpretation: Sinus rhythm Low voltage, precordial leads Confirmed by Raeford Razor [phone]) on 06/01/2019 1:39:48 PM   Radiology DG Chest Portable 1 View  Result Date: 06/01/2019 CLINICAL DATA:  Productive cough for the past week. EXAM: PORTABLE CHEST 1 VIEW COMPARISON:  10/09/2016 FINDINGS: Borderline enlarged cardiac silhouette. Mildly prominent pulmonary vasculature. Clear lungs. Unremarkable bones. IMPRESSION: Borderline cardiomegaly and mild pulmonary vascular congestion. Electronically Signed   By: Beckie Salts M.D.   On: 06/01/2019 11:49    Procedures Procedures (including critical care time)  Medications Ordered in ED Medications  benzonatate (TESSALON) capsule 200 mg (200 mg Oral Given 06/01/19 1201)  lidocaine (XYLOCAINE) 2 % viscous mouth solution 15 mL (15 mLs Mouth/Throat Given 06/01/19 1202)    ED Course  I have reviewed the triage vital signs and the nursing notes.  Pertinent labs & imaging results that were available during my care of the patient were reviewed by me and considered in my medical decision making (see chart for details).    MDM Rules/Calculators/A&P                       This x-ray viewed by me shows cardiomegaly and mild  vascular congestion.  Discussing results with patient she does admit to orthopnea for the last x1 year.  She has not noticed any swelling in her legs.  She denies personal cardiac history.  She states her father has history of  hypertension, no family members with MI at young age.  Continues to deny any chest pain. Will add labs, BNP, EKG to work-up.  CBC with no leukocytosis, no anemia.  BMP shows no severe electrolyte derangement, no renal insufficiency.  Pregnancy test is negative.  BNP is within normal range at 29.1.  EKG without ischemia.  Patient ambulated without any respiratory distress, hypoxia or Procardia.  Oxygen saturation stayed above 96% on room air. Will send ambulatory referral to cardiology and discharge patient on short course of Lasix.  She is also requesting Covid test at discharge.  Test performed and patient aware she needs to quarantine till she has a test result.  The patient appears reasonably screened and/or stabilized for discharge and I doubt any other medical condition or other [facility] requiring further screening, evaluation, or treatment in the ED at this time prior to discharge. The patient is safe for discharge with strict return precautions discussed. Findings and plan of care discussed with supervising physician Dr. Juleen China.   Jodi York was evaluated in Emergency Department on 06/01/2019 for the symptoms described in the history of present illness. She was evaluated in the context of the global COVID-19 pandemic, which necessitated consideration that the patient might be at risk for infection with the SARS-CoV-2 virus that causes COVID-19. Institutional protocols and algorithms that pertain to the evaluation of patients at risk for COVID-19 are in a state of rapid change based on information released by regulatory bodies including the CDC and federal and state organizations. These policies and algorithms were followed during the patient's care in the ED.   Portions of this note  were generated with Scientist, clinical (histocompatibility and immunogenetics). Dictation errors may occur despite best attempts at proofreading.  Final Clinical Impression(s) / ED Diagnoses Final diagnoses:  Cough  Pulmonary vascular congestion    Rx / DC Orders ED Discharge Orders         Ordered    Ambulatory referral to Cardiology     06/01/19 1340    furosemide (LASIX) 20 MG tablet  Daily     06/01/19 1345           Sherene Sires, PA-C 06/01/19 1349    Raeford Razor, MD 06/04/19 [phone]

## 2019-06-01 NOTE — ED Notes (Signed)
Patient ambulated to RR and back without assistance, oxygen ranged from 98-99% RA. NAD, no coughing episodes.

## 2019-06-01 NOTE — ED Triage Notes (Signed)
Per pt, states she has had a productive cough for over a week-OTC allergy meds not helping-has not taken anything for the cough

## 2019-06-01 NOTE — Discharge Instructions (Addendum)
You have been seen today for cough. Please read and follow all provided instructions. Return to the emergency room for worsening condition or new concerning symptoms.     Covid test should result in 24 hours.  You will get a phone call if it is positive.  If negative it will be available for you to see in your MyChart.  1. Medications:  Prescription to your pharmacy for Lasix.  This is a fluid pill.  This will cause you to have increased urination, that is a normal effect of the medicine.  All your lab work was normal.  Your chest x-ray today showed you have pulmonary vascular congestion which is the medical way to say fluid in your lungs.  It is likely was causing you to be short of breath  Continue usual home medications Take medications as prescribed. Please review all of the medicines and only take them if you do not have an allergy to them.   2. Treatment: rest.  Watch your salt intake.  If needed you can sleep on multiple pillows at night.  3. Follow Up: Please follow-up with cardiology.  I have sent a referral to them and they should be contacting you in 1 week.  If you do not hear from them you can try calling the office. -Also recommend follow-up with primary care doctor  It is also a possibility that you have an allergic reaction to any of the medicines that you have been prescribed - Everybody reacts differently to medications and while MOST people have no trouble with most medicines, you may have a reaction such as nausea, vomiting, rash, swelling, shortness of breath. If this is the case, please stop taking the medicine immediately and contact your physician.  ?

## 2019-06-02 LAB — SARS CORONAVIRUS 2 (TAT 6-24 HRS): SARS Coronavirus 2: NEGATIVE

## 2019-06-09 ENCOUNTER — Telehealth: Payer: Self-pay | Admitting: Internal Medicine

## 2019-06-09 NOTE — Telephone Encounter (Signed)
Note has been added to  appointment note for 06/11/19

## 2019-06-09 NOTE — Telephone Encounter (Signed)
   Pt's father requesting if pt's Mom can came in with her to her appt on 06/11/19. He said his wife is an Charity fundraiser in [place] and would like to be with pt for support and would like to know her plan of care  Please advise

## 2019-06-09 NOTE — Telephone Encounter (Signed)
The patient has been made aware that Dr. Jacques Navy stated that this would be okay.

## 2019-06-11 ENCOUNTER — Ambulatory Visit (INDEPENDENT_AMBULATORY_CARE_PROVIDER_SITE_OTHER): Payer: BC Managed Care – PPO | Admitting: Internal Medicine

## 2019-06-11 ENCOUNTER — Other Ambulatory Visit: Payer: Self-pay

## 2019-06-11 ENCOUNTER — Encounter: Payer: Self-pay | Admitting: Internal Medicine

## 2019-06-11 VITALS — BP 128/84 | HR 89 | Ht 65.0 in | Wt 229.8 lb

## 2019-06-11 DIAGNOSIS — R079 Chest pain, unspecified: Secondary | ICD-10-CM | POA: Diagnosis not present

## 2019-06-11 DIAGNOSIS — R0602 Shortness of breath: Secondary | ICD-10-CM | POA: Diagnosis not present

## 2019-06-11 DIAGNOSIS — E785 Hyperlipidemia, unspecified: Secondary | ICD-10-CM | POA: Diagnosis not present

## 2019-06-11 NOTE — Patient Instructions (Addendum)
Medication Instructions:  Your physician recommends that you continue on your current medications as directed. Please refer to the Current Medication list given to you today.  *If you need a refill on your cardiac medications before your next appointment, please call your pharmacy*  Lab Work:  You will have labs drawn today: Troponin, sedimentation rate, CRP  If you have labs (blood work) drawn today and your tests are completely normal, you will receive your results only by: Marland Kitchen MyChart Message (if you have MyChart) OR . A paper copy in the mail If you have any lab test that is abnormal or we need to change your treatment, we will call you to review the results.  Testing/Procedures:  Your physician has requested that you have an echocardiogram on 06/26/19 at 7:35AM. Echocardiography is a painless test that uses sound waves to create images of your heart. It provides your doctor with information about the size and shape of your heart and how well your heart's chambers and valves are working. This procedure takes approximately one hour. There are no restrictions for this procedure.  Follow-Up: At [facility], you and your health needs are our priority.  As part of our continuing mission to provide you with exceptional heart care, we have created designated Provider Care Teams.  These Care Teams include your primary Cardiologist (physician) and Advanced Practice Providers (APPs -  Physician Assistants and Nurse Practitioners) who all work together to provide you with the care you need, when you need it.  We recommend signing up for the patient portal called "MyChart".  Sign up information is provided on this After Visit Summary.  MyChart is used to connect with patients for Virtual Visits (Telemedicine).  Patients are able to view lab/test results, encounter notes, upcoming appointments, etc.  Non-urgent messages can be sent to your provider as well.   To learn more about what you can do with  MyChart, go to [email or web address].    Your next appointment:    On 06/30/19 at 9:00AM with Dr. Jacques Navy

## 2019-06-11 NOTE — Progress Notes (Signed)
Cardiology Office Note:    Date:  06/11/2019   ID:  Jodi York, DOB 1995-09-22, MRN [id number]  PCP:  [facility]  Cardiologist:  No primary care provider on file.  Electrophysiologist:  None   Referring MD: Cherre Robins, PA*   Chief Complaint: Lifelong chest pain and recent episode of productive cough  History of Present Illness:    Jodi York is a 24 y.o. female with no significant past medical history referred from the emergency department for chest x-ray with mild cardiomegaly and mild vascular congestion in 1 year of orthopnea.  She presents today with her mother.  Patient tells me that she has had lifelong chest pain.  She notes orthopnea where she feels difficulty breathing and chest discomfort.  She feels she has apnea and her boyfriend has to wake her up from sleep.  She notes a sense of lots of mucus and a productive cough recently.  She is a non-smoker no recreational drug use.  Her chest pain is described as every time she breathes she has pain, she feels she is wheezing and she has to sit up to breathe.  She does not feel she can breathe when she lies flat.  She also notes a sense of dyspnea on exertion that started a month ago with her cough with productive sputum.  She has been given Lasix in the past and it did not help.  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  She has a 24-year-old son and endorses significant life stressors.  During her pregnancy she denies a history of gestational hypertension or diabetes.  Her cholesterol is elevated but she is not currently on any therapy.  Past Medical History:  Diagnosis Date  . Medical history non-contributory     Past Surgical History:  Procedure Laterality Date  . TONSILLECTOMY      Current Medications: No outpatient medications have been marked as taking for the 06/11/19 encounter (Office Visit) with Elouise Munroe, MD.     Allergies:   Shellfish allergy,  Penicillins, and Pollen extract   Social History   Socioeconomic History  . Marital status: Single    Spouse name: Not on file  . Number of children: Not on file  . Years of education: Not on file  . Highest education level: Not on file  Occupational History  . Not on file  Tobacco Use  . Smoking status: Never Smoker  . Smokeless tobacco: Never Used  Substance and Sexual Activity  . Alcohol use: No  . Drug use: No  . Sexual activity: Not on file  Other Topics Concern  . Not on file  Social History Narrative  . Not on file   Social Determinants of Health   Financial Resource Strain:   . Difficulty of Paying Living Expenses:   Food Insecurity:   . Worried About Charity fundraiser in the Last Year:   . Arboriculturist in the Last Year:   Transportation Needs:   . Film/video editor (Medical):   Marland Kitchen Lack of Transportation (Non-Medical):   Physical Activity:   . Days of Exercise per Week:   . Minutes of Exercise per Session:   Stress:   . Feeling of Stress :   Social Connections:   . Frequency of Communication with Friends and Family:   . Frequency of Social Gatherings with Friends and Family:   . Attends Religious Services:   . Active Member  of Clubs or Organizations:   . Attends Banker Meetings:   Marland Kitchen Marital Status:      Family History: The patient's family history includes Hypertension in her father.  ROS:   Please see the history of present illness.    All other systems reviewed and are negative.  EKGs/Labs/Other Studies Reviewed:    The following studies were reviewed today:  EKG: Normal sinus rhythm, poor R wave progression  Chest x-ray 06/01/2019-interpretation read as cardiomegaly and mild vascular congestion.  Recent Labs: 06/01/2019: B Natriuretic Peptide 29.1; BUN 11; Creatinine, Ser 0.53; Hemoglobin 12.3; Platelets 292; Potassium 3.8; Sodium 137  Recent Lipid Panel No results found for: CHOL, TRIG, HDL, CHOLHDL, VLDL, LDLCALC,  LDLDIRECT  Physical Exam:    VS:  BP 128/84   Pulse 89   Ht 5\' 5"  (1.651 m)   Wt 229 lb 12.8 oz (104.2 kg)   LMP 05/15/2019   SpO2 96%   BMI 38.24 kg/m     Wt Readings from Last 5 Encounters:  06/11/19 229 lb 12.8 oz (104.2 kg)  01/25/17 233 lb (105.7 kg)  10/09/16 213 lb (96.6 kg)     Constitutional: No acute distress Eyes: sclera non-icteric, normal conjunctiva and lids ENMT: normal dentition, moist mucous membranes Cardiovascular: regular rhythm, normal rate, no murmurs. S1 and S2 normal. Radial pulses normal bilaterally. No jugular venous distention.  Respiratory: clear to auscultation bilaterally GI : normal bowel sounds, soft and nontender. No distention.   MSK: extremities warm, well perfused. No edema.  NEURO: grossly nonfocal exam, moves all extremities. PSYCH: alert and oriented x 3, normal mood and affect.   ASSESSMENT:    1. Chest pain of uncertain etiology   2. Shortness of breath   3. Hyperlipidemia, unspecified hyperlipidemia type    PLAN:    Chest pain of uncertain etiology - Plan: EKG 12-Lead, C-reactive protein, Sedimentation rate, ECHOCARDIOGRAM COMPLETE, Troponin I  Shortness of breath - Plan: EKG 12-Lead, C-reactive protein, Sedimentation rate, ECHOCARDIOGRAM COMPLETE, Troponin I  Given her history of cough productive of sputum, it is possible that she has had a viral infection with a protracted recovery.  In light of this her chest pain and shortness of breath that recently prompted ER presentation may be related to viral pericarditis.  To evaluate for pericarditis and myocarditis, will obtain CRP, sed rate, troponin.  Will obtain an echocardiogram due to septal infarct pattern on EKG and description of shortness of breath and chest pain to better evaluate the pericardium and given her young age exclude congenital lesions.  Hyperlipidemia, unspecified hyperlipidemia type-per patient report, lipids not available for review.  Total time of  encounter: 45 minutes total time of encounter, including 30 minutes spent in face-to-face patient care on the date of this encounter. This time includes coordination of care and counseling regarding above mentioned problem list. Remainder of non-face-to-face time involved reviewing chart documents/testing relevant to the patient encounter and documentation in the medical record. I have independently reviewed documentation from referring provider.   10/11/16, MD [place]  [facility]    Medication Adjustments/Labs and Tests Ordered: Current medicines are reviewed at length with the patient today.  Concerns regarding medicines are outlined above.  No orders of the defined types were placed in this encounter.  No orders of the defined types were placed in this encounter.   There are no Patient Instructions on file for this visit.

## 2019-06-12 LAB — C-REACTIVE PROTEIN: CRP: 8 mg/L (ref 0–10)

## 2019-06-12 LAB — SEDIMENTATION RATE: Sed Rate: 70 mm/hr — ABNORMAL HIGH (ref 0–32)

## 2019-06-12 LAB — TROPONIN I: Troponin I: <0.01 ng/mL (ref 0.00–0.04)

## 2019-06-17 ENCOUNTER — Telehealth: Payer: Self-pay | Admitting: *Deleted

## 2019-06-17 NOTE — Telephone Encounter (Signed)
-----   Message from Parke Poisson, MD sent at 06/12/2019  6:08 PM EDT ----- Crp and troponin normal, sed rate is elevated which can be nonspecific. If she would like to try colchicine for chest pain, would start with 0.6 mg BID and continue for 3 mo for possible pericarditis.

## 2019-06-17 NOTE — Telephone Encounter (Signed)
  Left detail message to patient to call back - would like to start a medication . Also result released to mychart to review.

## 2019-06-18 MED ORDER — COLCHICINE 0.6 MG PO TABS: 0.6000 mg | ORAL_TABLET | Freq: Two times a day (BID) | ORAL | 0 refills | Status: DC

## 2019-06-18 NOTE — Telephone Encounter (Signed)
The patient has been notified of the result and verbalized understanding.  All questions (if any) were answered. Patient is in agreement to start medication Medication was e-sent to pharmacy . Tobin Chad, RN 06/18/2019 12:20 PM

## 2019-06-18 NOTE — Telephone Encounter (Signed)
Follow up  Pt returning call from [facility] regarding her result and new medications  Please call

## 2019-06-26 ENCOUNTER — Ambulatory Visit (HOSPITAL_COMMUNITY): Payer: BC Managed Care – PPO | Attending: Cardiovascular Disease

## 2019-06-26 ENCOUNTER — Encounter (HOSPITAL_COMMUNITY): Payer: Self-pay | Admitting: *Deleted

## 2019-06-26 ENCOUNTER — Other Ambulatory Visit: Payer: Self-pay

## 2019-06-26 DIAGNOSIS — R079 Chest pain, unspecified: Secondary | ICD-10-CM | POA: Diagnosis present

## 2019-06-26 DIAGNOSIS — R0602 Shortness of breath: Secondary | ICD-10-CM | POA: Diagnosis not present

## 2019-06-30 ENCOUNTER — Telehealth: Payer: Self-pay | Admitting: *Deleted

## 2019-06-30 ENCOUNTER — Telehealth: Payer: Self-pay | Admitting: Internal Medicine

## 2019-06-30 NOTE — Telephone Encounter (Signed)
Have called patient 5 times and left 2 messages to start virtual visit, unable to reach patient

## 2019-06-30 NOTE — Telephone Encounter (Signed)
If patient calls back she will need to reschedule

## 2019-09-11 LAB — OB RESULTS CONSOLE HIV ANTIBODY (ROUTINE TESTING): HIV: NONREACTIVE

## 2019-09-11 LAB — OB RESULTS CONSOLE HEPATITIS B SURFACE ANTIGEN: Hepatitis B Surface Ag: NEGATIVE

## 2019-09-11 LAB — OB RESULTS CONSOLE RUBELLA ANTIBODY, IGM: Rubella: IMMUNE

## 2020-01-19 ENCOUNTER — Other Ambulatory Visit: Payer: Self-pay | Admitting: Obstetrics and Gynecology

## 2020-01-19 DIAGNOSIS — Z3A33 33 weeks gestation of pregnancy: Secondary | ICD-10-CM

## 2020-01-19 DIAGNOSIS — O99213 Obesity complicating pregnancy, third trimester: Secondary | ICD-10-CM

## 2020-01-19 DIAGNOSIS — Z363 Encounter for antenatal screening for malformations: Secondary | ICD-10-CM

## 2020-01-28 ENCOUNTER — Encounter: Payer: Self-pay | Admitting: *Deleted

## 2020-01-30 ENCOUNTER — Encounter: Payer: Self-pay | Admitting: *Deleted

## 2020-01-30 ENCOUNTER — Other Ambulatory Visit: Payer: Self-pay

## 2020-01-30 ENCOUNTER — Ambulatory Visit: Payer: Medicaid Other | Admitting: *Deleted

## 2020-01-30 ENCOUNTER — Ambulatory Visit: Payer: Medicaid Other | Attending: Obstetrics and Gynecology

## 2020-01-30 VITALS — BP 111/69 | HR 100 | Ht 67.0 in

## 2020-01-30 DIAGNOSIS — Z6837 Body mass index (BMI) 37.0-37.9, adult: Secondary | ICD-10-CM

## 2020-01-30 DIAGNOSIS — E669 Obesity, unspecified: Secondary | ICD-10-CM

## 2020-01-30 DIAGNOSIS — Z363 Encounter for antenatal screening for malformations: Secondary | ICD-10-CM

## 2020-01-30 DIAGNOSIS — Z3A33 33 weeks gestation of pregnancy: Secondary | ICD-10-CM

## 2020-01-30 DIAGNOSIS — O99213 Obesity complicating pregnancy, third trimester: Secondary | ICD-10-CM | POA: Diagnosis not present

## 2020-02-14 NOTE — L&D Delivery Note (Signed)
Delivery Note Patient was admitted with PROM.  She was started on pitocin and progressed along a normal labor curve.  She pushed well for < 10 minutes. At 7:44 AM a viable female was delivered via Vaginal, Spontaneous (Presentation:   Occiput Posterior).  APGAR: 9, 9; weight 3181 gm (7lb 0.2oz) .   Placenta status: Spontaneous, Intact.  Cord: 3 vessels with the following complications: None.  Cord pH: n/a  Anesthesia:  Epidural Episiotomy:  None Lacerations:  none Suture Repair: n/a Est. Blood Loss (mL):  50 mL  Mom to postpartum.  Baby to Couplet care / Skin to Skin.  Guliana Weyandt GEFFEL Zachary Lovins 03/13/2020, 8:00 AM

## 2020-02-25 LAB — OB RESULTS CONSOLE GBS: GBS: NEGATIVE

## 2020-03-13 ENCOUNTER — Other Ambulatory Visit: Payer: Self-pay

## 2020-03-13 ENCOUNTER — Inpatient Hospital Stay (HOSPITAL_COMMUNITY): Payer: Medicaid Other | Admitting: Anesthesiology

## 2020-03-13 ENCOUNTER — Inpatient Hospital Stay (HOSPITAL_COMMUNITY)
Admission: AD | Admit: 2020-03-13 | Discharge: 2020-03-14 | DRG: 807 | Disposition: A | Discharge status: Home / Self Care | Payer: Medicaid Other | Attending: Obstetrics | Admitting: Obstetrics

## 2020-03-13 ENCOUNTER — Encounter (HOSPITAL_COMMUNITY): Payer: Self-pay | Admitting: Obstetrics

## 2020-03-13 DIAGNOSIS — Z3A39 39 weeks gestation of pregnancy: Secondary | ICD-10-CM

## 2020-03-13 DIAGNOSIS — E669 Obesity, unspecified: Secondary | ICD-10-CM | POA: Diagnosis present

## 2020-03-13 DIAGNOSIS — O42013 Preterm premature rupture of membranes, onset of labor within 24 hours of rupture, third trimester: Secondary | ICD-10-CM | POA: Diagnosis present

## 2020-03-13 DIAGNOSIS — O99214 Obesity complicating childbirth: Secondary | ICD-10-CM | POA: Diagnosis present

## 2020-03-13 DIAGNOSIS — Z23 Encounter for immunization: Secondary | ICD-10-CM

## 2020-03-13 DIAGNOSIS — Z20822 Contact with and (suspected) exposure to covid-19: Secondary | ICD-10-CM | POA: Diagnosis present

## 2020-03-13 DIAGNOSIS — O4290 Premature rupture of membranes, unspecified as to length of time between rupture and onset of labor, unspecified weeks of gestation: Secondary | ICD-10-CM | POA: Diagnosis present

## 2020-03-13 DIAGNOSIS — Z880 Allergy status to penicillin: Secondary | ICD-10-CM | POA: Diagnosis not present

## 2020-03-13 DIAGNOSIS — O4202 Full-term premature rupture of membranes, onset of labor within 24 hours of rupture: Secondary | ICD-10-CM | POA: Diagnosis present

## 2020-03-13 DIAGNOSIS — O26893 Other specified pregnancy related conditions, third trimester: Secondary | ICD-10-CM | POA: Diagnosis present

## 2020-03-13 LAB — CBC
HCT: 34.2 % — ABNORMAL LOW (ref 36.0–46.0)
Hemoglobin: 11.5 g/dL — ABNORMAL LOW (ref 12.0–15.0)
MCH: 28.2 pg (ref 26.0–34.0)
MCHC: 33.6 g/dL (ref 30.0–36.0)
MCV: 83.8 fL (ref 80.0–100.0)
Platelets: 229 10*3/uL (ref 150–400)
RBC: 4.08 MIL/uL (ref 3.87–5.11)
RDW: 14.6 % (ref 11.5–15.5)
WBC: 8.5 10*3/uL (ref 4.0–10.5)
nRBC: 0 % (ref 0.0–0.2)

## 2020-03-13 LAB — COMPREHENSIVE METABOLIC PANEL
ALT: 16 U/L (ref 0–44)
AST: 22 U/L (ref 15–41)
Albumin: 2.9 g/dL — ABNORMAL LOW (ref 3.5–5.0)
Alkaline Phosphatase: 104 U/L (ref 38–126)
Anion gap: 11 (ref 5–15)
BUN: 9 mg/dL (ref 6–20)
CO2: 19 mmol/L — ABNORMAL LOW (ref 22–32)
Calcium: 9.2 mg/dL (ref 8.9–10.3)
Chloride: 106 mmol/L (ref 98–111)
Creatinine, Ser: 0.55 mg/dL (ref 0.44–1.00)
GFR, Estimated: >60 mL/min (ref >60)
Glucose, Bld: 80 mg/dL (ref 70–99)
Potassium: 3.6 mmol/L (ref 3.5–5.1)
Sodium: 136 mmol/L (ref 135–145)
Total Bilirubin: 0.7 mg/dL (ref 0.3–1.2)
Total Protein: 7.1 g/dL (ref 6.5–8.1)

## 2020-03-13 LAB — RPR: RPR Ser Ql: NONREACTIVE

## 2020-03-13 LAB — TYPE AND SCREEN
ABO/RH(D): A POS
Antibody Screen: NEGATIVE

## 2020-03-13 LAB — SARS CORONAVIRUS 2 BY RT PCR (HOSPITAL ORDER, PERFORMED IN ~~LOC~~ HOSPITAL LAB): SARS Coronavirus 2: NEGATIVE

## 2020-03-13 LAB — POCT FERN TEST: POCT Fern Test: POSITIVE

## 2020-03-13 MED ORDER — BENZOCAINE-MENTHOL 20-0.5 % EX AERO
1.0000 "application " | INHALATION_SPRAY | CUTANEOUS | Status: DC | PRN
  Administered 2020-03-13: 1 via TOPICAL
  Filled 2020-03-13: qty 56

## 2020-03-13 MED ORDER — DIPHENHYDRAMINE HCL 25 MG PO CAPS: 25.0000 mg | ORAL_CAPSULE | Freq: Four times a day (QID) | ORAL | Status: DC | PRN

## 2020-03-13 MED ORDER — ONDANSETRON HCL 4 MG/2ML IJ SOLN
4.0000 mg | Freq: Four times a day (QID) | INTRAMUSCULAR | Status: DC | PRN
Start: 2020-03-13 — End: 2020-03-13

## 2020-03-13 MED ORDER — PHENYLEPHRINE 40 MCG/ML (10ML) SYRINGE FOR IV PUSH (FOR BLOOD PRESSURE SUPPORT)
80.0000 ug | PREFILLED_SYRINGE | INTRAVENOUS | Status: DC | PRN
  Filled 2020-03-13: qty 10

## 2020-03-13 MED ORDER — LACTATED RINGERS IV SOLN: 500.0000 mL | Freq: Once | INTRAVENOUS | Status: DC

## 2020-03-13 MED ORDER — EPHEDRINE 5 MG/ML INJ: 10.0000 mg | INTRAVENOUS | Status: DC | PRN

## 2020-03-13 MED ORDER — FENTANYL CITRATE (PF) 100 MCG/2ML IJ SOLN
50.0000 ug | INTRAMUSCULAR | Status: DC | PRN
  Administered 2020-03-13: 100 ug via INTRAVENOUS
  Filled 2020-03-13: qty 2

## 2020-03-13 MED ORDER — PHENYLEPHRINE 40 MCG/ML (10ML) SYRINGE FOR IV PUSH (FOR BLOOD PRESSURE SUPPORT): 80.0000 ug | PREFILLED_SYRINGE | INTRAVENOUS | Status: DC | PRN

## 2020-03-13 MED ORDER — DIBUCAINE (PERIANAL) 1 % EX OINT: 1.0000 "application " | TOPICAL_OINTMENT | CUTANEOUS | Status: DC | PRN

## 2020-03-13 MED ORDER — ACETAMINOPHEN 325 MG PO TABS
650.0000 mg | ORAL_TABLET | ORAL | Status: DC | PRN
  Administered 2020-03-14: 650 mg via ORAL
  Filled 2020-03-13: qty 2

## 2020-03-13 MED ORDER — ONDANSETRON HCL 4 MG PO TABS: 4.0000 mg | ORAL_TABLET | ORAL | Status: DC | PRN

## 2020-03-13 MED ORDER — OXYTOCIN BOLUS FROM INFUSION
333.0000 mL | Freq: Once | INTRAVENOUS | Status: AC
  Administered 2020-03-13: 333 mL via INTRAVENOUS

## 2020-03-13 MED ORDER — OXYCODONE HCL 5 MG PO TABS: 10.0000 mg | ORAL_TABLET | ORAL | Status: DC | PRN

## 2020-03-13 MED ORDER — OXYCODONE-ACETAMINOPHEN 5-325 MG PO TABS: 1.0000 | ORAL_TABLET | ORAL | Status: DC | PRN

## 2020-03-13 MED ORDER — SENNOSIDES-DOCUSATE SODIUM 8.6-50 MG PO TABS
2.0000 | ORAL_TABLET | ORAL | Status: DC
  Administered 2020-03-13 – 2020-03-14 (×2): 2 via ORAL
  Filled 2020-03-13 (×2): qty 2

## 2020-03-13 MED ORDER — PRENATAL MULTIVITAMIN CH
1.0000 | ORAL_TABLET | Freq: Every day | ORAL | Status: DC
  Administered 2020-03-13 – 2020-03-14 (×2): 1 via ORAL
  Filled 2020-03-13 (×2): qty 1

## 2020-03-13 MED ORDER — SIMETHICONE 80 MG PO CHEW: 80.0000 mg | CHEWABLE_TABLET | ORAL | Status: DC | PRN

## 2020-03-13 MED ORDER — WITCH HAZEL-GLYCERIN EX PADS: 1.0000 "application " | MEDICATED_PAD | CUTANEOUS | Status: DC | PRN

## 2020-03-13 MED ORDER — ACETAMINOPHEN 325 MG PO TABS: 650.0000 mg | ORAL_TABLET | ORAL | Status: DC | PRN

## 2020-03-13 MED ORDER — TETANUS-DIPHTH-ACELL PERTUSSIS 5-2.5-18.5 LF-MCG/0.5 IM SUSY: 0.5000 mL | PREFILLED_SYRINGE | Freq: Once | INTRAMUSCULAR | Status: DC

## 2020-03-13 MED ORDER — INFLUENZA VAC SPLIT QUAD 0.5 ML IM SUSY
0.5000 mL | PREFILLED_SYRINGE | INTRAMUSCULAR | Status: AC
  Administered 2020-03-14: 0.5 mL via INTRAMUSCULAR
  Filled 2020-03-13: qty 0.5

## 2020-03-13 MED ORDER — LACTATED RINGERS IV SOLN: INTRAVENOUS | Status: DC

## 2020-03-13 MED ORDER — OXYCODONE-ACETAMINOPHEN 5-325 MG PO TABS: 2.0000 | ORAL_TABLET | ORAL | Status: DC | PRN

## 2020-03-13 MED ORDER — LIDOCAINE HCL (PF) 1 % IJ SOLN
INTRAMUSCULAR | Status: DC | PRN
  Administered 2020-03-13 (×2): 4 mL via EPIDURAL

## 2020-03-13 MED ORDER — OXYTOCIN-SODIUM CHLORIDE 30-0.9 UT/500ML-% IV SOLN: 2.5000 [IU]/h | INTRAVENOUS | Status: DC

## 2020-03-13 MED ORDER — OXYTOCIN-SODIUM CHLORIDE 30-0.9 UT/500ML-% IV SOLN
1.0000 m[IU]/min | INTRAVENOUS | Status: DC
  Administered 2020-03-13: 2 m[IU]/min via INTRAVENOUS
  Filled 2020-03-13: qty 500

## 2020-03-13 MED ORDER — IBUPROFEN 600 MG PO TABS
600.0000 mg | ORAL_TABLET | Freq: Four times a day (QID) | ORAL | Status: DC
  Administered 2020-03-13 – 2020-03-14 (×4): 600 mg via ORAL
  Filled 2020-03-13 (×5): qty 1

## 2020-03-13 MED ORDER — DIPHENHYDRAMINE HCL 50 MG/ML IJ SOLN: 12.5000 mg | INTRAMUSCULAR | Status: DC | PRN

## 2020-03-13 MED ORDER — FENTANYL-BUPIVACAINE-NACL 0.5-0.125-0.9 MG/250ML-% EP SOLN
12.0000 mL/h | EPIDURAL | Status: DC | PRN
Start: 2020-03-13 — End: 2020-03-13
  Administered 2020-03-13: 12 mL/h via EPIDURAL
  Filled 2020-03-13: qty 250

## 2020-03-13 MED ORDER — OXYCODONE HCL 5 MG PO TABS: 5.0000 mg | ORAL_TABLET | ORAL | Status: DC | PRN

## 2020-03-13 MED ORDER — TERBUTALINE SULFATE 1 MG/ML IJ SOLN: 0.2500 mg | Freq: Once | INTRAMUSCULAR | Status: DC | PRN

## 2020-03-13 MED ORDER — LIDOCAINE HCL (PF) 1 % IJ SOLN: 30.0000 mL | INTRAMUSCULAR | Status: DC | PRN

## 2020-03-13 MED ORDER — SOD CITRATE-CITRIC ACID 500-334 MG/5ML PO SOLN
30.0000 mL | ORAL | Status: DC | PRN
Start: 2020-03-13 — End: 2020-03-13

## 2020-03-13 MED ORDER — COCONUT OIL OIL: 1.0000 "application " | TOPICAL_OIL | Status: DC | PRN

## 2020-03-13 MED ORDER — LACTATED RINGERS IV SOLN
500.0000 mL | INTRAVENOUS | Status: DC | PRN
Start: 2020-03-13 — End: 2020-03-13

## 2020-03-13 MED ORDER — ONDANSETRON HCL 4 MG/2ML IJ SOLN: 4.0000 mg | INTRAMUSCULAR | Status: DC | PRN

## 2020-03-13 NOTE — Anesthesia Procedure Notes (Signed)

## 2020-03-13 NOTE — MAU Note (Signed)
Pt reports reports a gush of clear fluid at 2300.   Denies vaginal bleeding.   Reports decreased fetal movement this week.

## 2020-03-13 NOTE — Anesthesia Postprocedure Evaluation (Signed)
Anesthesia Post Note  Patient: Jodi York  Procedure(s) Performed: AN AD HOC LABOR EPIDURAL     Patient location during evaluation: Mother Baby Anesthesia Type: Epidural Level of consciousness: awake and alert Pain management: pain level controlled Vital Signs Assessment: post-procedure vital signs reviewed and stable Respiratory status: spontaneous breathing, nonlabored ventilation and respiratory function stable Cardiovascular status: stable Postop Assessment: no headache, no backache and epidural receding Anesthetic complications: no   No complications documented.  Last Vitals:  Vitals:   03/13/20 0950 03/13/20 1109  BP: 115/74 123/81  Pulse: 74 87  Resp: 16 17  Temp: 37 C 37 C  SpO2:      Last Pain:  Vitals:   03/13/20 1109  TempSrc: Oral  PainSc:    Pain Goal:                   [facility]ica Records

## 2020-03-13 NOTE — Anesthesia Preprocedure Evaluation (Signed)
Anesthesia Evaluation  Patient identified by MRN, date of birth, ID band Patient awake    Reviewed: Allergy & Precautions, Patient's Chart, lab work & pertinent test results  Airway Mallampati: II  TM Distance: >3 FB Neck ROM: Full    Dental no notable dental hx. (+) Teeth Intact   Pulmonary neg pulmonary ROS,    Pulmonary exam normal breath sounds clear to auscultation       Cardiovascular negative cardio ROS Normal cardiovascular exam Rhythm:Regular Rate:Normal     Neuro/Psych negative neurological ROS  negative psych ROS   GI/Hepatic Neg liver ROS, GERD  ,  Endo/Other  Morbid obesity  Renal/GU negative Renal ROS  negative genitourinary   Musculoskeletal negative musculoskeletal ROS (+)   Abdominal (+) + obese,   Peds  Hematology  (+) anemia ,   Anesthesia Other Findings   Reproductive/Obstetrics (+) Pregnancy SROM                             Anesthesia Physical Anesthesia Plan  ASA: III  Anesthesia Plan: Epidural   Post-op Pain Management:    Induction:   PONV Risk Score and Plan: Treatment may vary due to age or medical condition  Airway Management Planned: Natural Airway  Additional Equipment:   Intra-op Plan:   Post-operative Plan:   Informed Consent: I have reviewed the patients History and Physical, chart, labs and discussed the procedure including the risks, benefits and alternatives for the proposed anesthesia with the patient or authorized representative who has indicated his/her understanding and acceptance.       Plan Discussed with: Anesthesiologist  Anesthesia Plan Comments:         Anesthesia Quick Evaluation

## 2020-03-13 NOTE — H&P (Signed)
25 y.o. G2P1001 @ [redacted]w[redacted]d presents with leakage of fluid starting yesterday evening at 2300.   Otherwise has good fetal movement and no bleeding.  Her pregnancy has been complicated by maternal obesity (pre-pregnancy BMI 35). Growth [place] on 12/17:  EFW 50% (4lb14oz).  Past Medical History:  Diagnosis Date  . Medical history non-contributory     Past Surgical History:  Procedure Laterality Date  . TONSILLECTOMY      OB History  Gravida Para Term Preterm AB Living  2 1 1     1   SAB IAB Ectopic Multiple Live Births          1    # Outcome Date GA Lbr Len/2nd Weight Sex Delivery Anes PTL Lv  2 Current           1 Term 2018     Vag-Spont   LIV    Social History   Socioeconomic History  . Marital status: Single    Spouse name: Not on file  . Number of children: Not on file  . Years of education: Not on file  . Highest education level: Not on file  Occupational History  . Not on file  Tobacco Use  . Smoking status: Never Smoker  . Smokeless tobacco: Never Used  Vaping Use  . Vaping Use: Never used  Substance and Sexual Activity  . Alcohol use: No  . Drug use: No  . Sexual activity: Yes    Birth control/protection: None  Other Topics Concern  . Not on file  Social History Narrative  . Not on file       Shellfish allergy, Penicillins, and Pollen extract    Prenatal Transfer Tool  Maternal Diabetes: No Genetic Screening: Declined Maternal Ultrasounds/Referrals: Normal Fetal Ultrasounds or other Referrals:  None Maternal Substance Abuse:  No Significant Maternal Medications:  None Significant Maternal Lab Results: Group B Strep negative  ABO, Rh: --/--/PENDING (01/29 0144) Antibody: PENDING (01/29 0144) Rubella:  Immune RPR:   NR HBsAg:   Negative HIV:   Negative GBS: Negative/-- (01/12 0000)     Vitals:   03/13/20 0140 03/13/20 0200  BP: 137/77 109/85  Pulse: 83 99  Resp:    Temp:    SpO2:  99%     General:  NAD Abdomen:  soft, gravid, EFW 7# Ex:  no  edema SVE:  1.5/50/-3 per RN FHTs:  130s, moderate variability, category 1 Toco:  irregular   A/P   25 y.o. G2P1001 [redacted]w[redacted]d presents with rupture of membranes Will start pitocin for augmentation Initial BP elevated in MAu, normal since.  Will monitor closely FSR/ vtx/ GBS negative  Lynae Pederson GEFFEL Tanysha Quant

## 2020-03-14 DIAGNOSIS — O4290 Premature rupture of membranes, unspecified as to length of time between rupture and onset of labor, unspecified weeks of gestation: Secondary | ICD-10-CM | POA: Diagnosis present

## 2020-03-14 LAB — CBC
HCT: 33.2 % — ABNORMAL LOW (ref 36.0–46.0)
Hemoglobin: 10.5 g/dL — ABNORMAL LOW (ref 12.0–15.0)
MCH: 27.3 pg (ref 26.0–34.0)
MCHC: 31.6 g/dL (ref 30.0–36.0)
MCV: 86.5 fL (ref 80.0–100.0)
Platelets: 198 10*3/uL (ref 150–400)
RBC: 3.84 MIL/uL — ABNORMAL LOW (ref 3.87–5.11)
RDW: 14.9 % (ref 11.5–15.5)
WBC: 10.3 10*3/uL (ref 4.0–10.5)
nRBC: 0 % (ref 0.0–0.2)

## 2020-03-14 NOTE — Discharge Summary (Signed)
Postpartum Discharge Summary  Date of Service updated 03/14/20     Patient Name: Jodi York DOB: 1995-04-11 MRN: [id number]  Date of admission: 03/13/2020 Delivery date:03/13/2020  Delivering provider: Marlow Baars  Date of discharge: 03/14/2020  Admitting diagnosis: Preterm premature rupture of membranes (PPROM) with onset of labor within 24 hours of rupture in third trimester, antepartum [O42.013] Intrauterine pregnancy: [redacted]w[redacted]d     Secondary diagnosis:  Active Problems:   Premature rupture of membranes (PROM) affecting second pregnancy  Additional problems: No    Discharge diagnosis: Term Pregnancy Delivered                                              Post partum procedures:none Augmentation: Pitocin Complications: None  Hospital course: Onset of Labor With Vaginal Delivery      25 y.o. yo [id number] at [redacted]w[redacted]d was admitted in Latent Labor on 03/13/2020. She was augmented with pitocin. Patient had an uncomplicated labor course as follows:  Membrane Rupture Time/Date: 12:10 AM ,03/13/2020   Delivery Method:Vaginal, Spontaneous  Episiotomy: None  Lacerations:  None  Patient had an uncomplicated postpartum course.  She is ambulating, tolerating a regular diet, passing flatus, and urinating well. Patient is discharged home in stable condition on 03/14/20.  Newborn Data: Birth date:03/13/2020  Birth time:7:44 AM  Gender:Female  Living status:Living  Apgars:9 ,9  Weight:3181 g     Physical exam  Vitals:   03/13/20 1500 03/13/20 1700 03/13/20 2148 03/14/20 0525  BP: 111/69 114/63 126/81 96/73  Pulse: 87 71 70 88  Resp: 18 16 18 18   Temp: 97.7 F (36.5 C) 98 F (36.7 C) 97.9 F (36.6 C) 97.7 F (36.5 C)  TempSrc: Axillary  Oral Oral  SpO2:  100% 100%   Weight:      Height:       General: alert, cooperative and no distress Lochia: appropriate Uterine Fundus: firm DVT Evaluation: No evidence of DVT seen on physical exam. Labs: Lab Results  Component Value Date   WBC  10.3 03/14/2020   HGB 10.5 (L) 03/14/2020   HCT 33.2 (L) 03/14/2020   MCV 86.5 03/14/2020   PLT 198 03/14/2020   CMP Latest Ref Rng & Units 03/13/2020  Glucose 70 - 99 mg/dL 80  BUN 6 - 20 mg/dL 9  Creatinine 03/15/2020 - 1.30 mg/dL 8.65  Sodium 7.84 - 696 mmol/L 136  Potassium 3.5 - 5.1 mmol/L 3.6  Chloride 98 - 111 mmol/L 106  CO2 22 - 32 mmol/L 19(L)  Calcium 8.9 - 10.3 mg/dL 9.2  Total Protein 6.5 - 8.1 g/dL 7.1  Total Bilirubin 0.3 - 1.2 mg/dL 0.7  Alkaline Phos 38 - 126 U/L 104  AST 15 - 41 U/L 22  ALT 0 - 44 U/L 16   Edinburgh Score: Edinburgh Postnatal Depression Scale Screening Tool 03/14/2020  I have been able to laugh and see the funny side of things. 0  I have looked forward with enjoyment to things. 1  I have blamed myself unnecessarily when things went wrong. 0  I have been anxious or worried for no good reason. 2  I have felt scared or panicky for no good reason. 0  Things have been getting on top of me. 1  I have been so unhappy that I have had difficulty sleeping. 0  I have felt sad or miserable. 0  I have been so unhappy that I have been crying. 0  The thought of harming myself has occurred to me. 0  Edinburgh Postnatal Depression Scale Total 4      After visit meds:  Allergies as of 03/14/2020      Reactions   Shellfish Allergy Anaphylaxis   Penicillins Nausea And Vomiting   Has patient had a PCN reaction causing immediate rash, facial/tongue/throat swelling, SOB or lightheadedness with hypotension: Yes Has patient had a PCN reaction causing severe rash involving mucus membranes or skin necrosis: No Has patient had a PCN reaction that required hospitalization No Has patient had a PCN reaction occurring within the last 10 years: Yes If all of the above answers are "NO", then may proceed with Cephalosporin use.   Pollen Extract Other (See Comments)   Eyes get puffy and face swells      Medication List    STOP taking these medications   colchicine 0.6 MG  tablet   furosemide 20 MG tablet Commonly known as: LASIX     TAKE these medications   prenatal multivitamin Tabs tablet Take 1 tablet by mouth daily at 12 noon.        Discharge home in stable condition Infant Feeding: Breast Infant Disposition:home with mother Discharge instruction: per After Visit Summary and Postpartum booklet. Activity: Advance as tolerated. Pelvic rest for 6 weeks.  Diet: routine diet  Postpartum Appointment:4 weeks  Future Appointments:No future appointments. Follow up Visit:  Follow-up Information    Marlow Baars, MD Follow up in 4 week(s).   Specialty: Obstetrics Contact information: [address] [place] [address] [phone]                   03/14/2020 [facility] Lizabeth Leyden, MD

## 2020-03-14 NOTE — Progress Notes (Signed)
Patient is doing well.  She is ambulating, voiding, tolerating PO.  Pain control is good.  Lochia is appropriate  Vitals:   03/13/20 1500 03/13/20 1700 03/13/20 2148 03/14/20 0525  BP: 111/69 114/63 126/81 96/73  Pulse: 87 71 70 88  Resp: 18 16 18 18   Temp: 97.7 F (36.5 C) 98 F (36.7 C) 97.9 F (36.6 C) 97.7 F (36.5 C)  TempSrc: Axillary  Oral Oral  SpO2:  100% 100%   Weight:      Height:        NAD Fundus firm Ext: No edema  Lab Results  Component Value Date   WBC 10.3 03/14/2020   HGB 10.5 (L) 03/14/2020   HCT 33.2 (L) 03/14/2020   MCV 86.5 03/14/2020   PLT 198 03/14/2020    --/--/A POS (01/29 0144)/RImmune  A/P 24 y.o. 09-13-2000 PPD#1. Meeting all goals.  Meeting all goals.  Discharge to home today.   [facility] GEFFEL [facility]

## 2020-03-18 ENCOUNTER — Inpatient Hospital Stay (HOSPITAL_COMMUNITY): Payer: Medicaid Other

## 2020-03-18 ENCOUNTER — Inpatient Hospital Stay (HOSPITAL_COMMUNITY)
Admission: AD | Admit: 2020-03-18 | Payer: Medicaid Other | Source: Home / Self Care | Admitting: Obstetrics and Gynecology

## 2020-05-02 ENCOUNTER — Other Ambulatory Visit: Payer: Self-pay

## 2020-05-02 ENCOUNTER — Emergency Department (HOSPITAL_COMMUNITY): Payer: Medicaid Other

## 2020-05-02 ENCOUNTER — Observation Stay (HOSPITAL_COMMUNITY)
Admission: EM | Admit: 2020-05-02 | Discharge: 2020-05-04 | Disposition: A | Discharge status: Home / Self Care | Payer: Medicaid Other | Attending: General Surgery | Admitting: General Surgery

## 2020-05-02 ENCOUNTER — Encounter (HOSPITAL_COMMUNITY): Payer: Self-pay

## 2020-05-02 DIAGNOSIS — R1031 Right lower quadrant pain: Secondary | ICD-10-CM

## 2020-05-02 DIAGNOSIS — Z20822 Contact with and (suspected) exposure to covid-19: Secondary | ICD-10-CM | POA: Insufficient documentation

## 2020-05-02 DIAGNOSIS — K3580 Unspecified acute appendicitis: Principal | ICD-10-CM | POA: Diagnosis present

## 2020-05-02 HISTORY — DX: Personal history of other diseases of the circulatory system: Z86.79

## 2020-05-02 HISTORY — DX: Urinary tract infection, site not specified: N39.0

## 2020-05-02 LAB — CBC WITH DIFFERENTIAL/PLATELET
Abs Immature Granulocytes: 0.06 10*3/uL (ref 0.00–0.07)
Basophils Absolute: 0 10*3/uL (ref 0.0–0.1)
Basophils Relative: 0 %
Eosinophils Absolute: 0.2 10*3/uL (ref 0.0–0.5)
Eosinophils Relative: 2 %
HCT: 35.4 % — ABNORMAL LOW (ref 36.0–46.0)
Hemoglobin: 11.2 g/dL — ABNORMAL LOW (ref 12.0–15.0)
Immature Granulocytes: 1 %
Lymphocytes Relative: 27 %
Lymphs Abs: 3 10*3/uL (ref 0.7–4.0)
MCH: 27.1 pg (ref 26.0–34.0)
MCHC: 31.6 g/dL (ref 30.0–36.0)
MCV: 85.7 fL (ref 80.0–100.0)
Monocytes Absolute: 0.7 10*3/uL (ref 0.1–1.0)
Monocytes Relative: 6 %
Neutro Abs: 7.1 10*3/uL (ref 1.7–7.7)
Neutrophils Relative %: 64 %
Platelets: 277 10*3/uL (ref 150–400)
RBC: 4.13 MIL/uL (ref 3.87–5.11)
RDW: 14.5 % (ref 11.5–15.5)
WBC: 11 10*3/uL — ABNORMAL HIGH (ref 4.0–10.5)
nRBC: 0 % (ref 0.0–0.2)

## 2020-05-02 LAB — COMPREHENSIVE METABOLIC PANEL
ALT: 16 U/L (ref 0–44)
AST: 16 U/L (ref 15–41)
Albumin: 3.6 g/dL (ref 3.5–5.0)
Alkaline Phosphatase: 64 U/L (ref 38–126)
Anion gap: 7 (ref 5–15)
BUN: 18 mg/dL (ref 6–20)
CO2: 24 mmol/L (ref 22–32)
Calcium: 9.1 mg/dL (ref 8.9–10.3)
Chloride: 108 mmol/L (ref 98–111)
Creatinine, Ser: 0.78 mg/dL (ref 0.44–1.00)
GFR, Estimated: >60 mL/min (ref >60)
Glucose, Bld: 88 mg/dL (ref 70–99)
Potassium: 3.9 mmol/L (ref 3.5–5.1)
Sodium: 139 mmol/L (ref 135–145)
Total Bilirubin: 0.7 mg/dL (ref 0.3–1.2)
Total Protein: 7.7 g/dL (ref 6.5–8.1)

## 2020-05-02 LAB — WET PREP, GENITAL
Clue Cells Wet Prep HPF POC: NONE SEEN
Sperm: NONE SEEN
Trich, Wet Prep: NONE SEEN
Yeast Wet Prep HPF POC: NONE SEEN

## 2020-05-02 LAB — URINALYSIS, ROUTINE W REFLEX MICROSCOPIC
Bilirubin Urine: NEGATIVE
Glucose, UA: NEGATIVE mg/dL
Hgb urine dipstick: NEGATIVE
Ketones, ur: NEGATIVE mg/dL
Leukocytes,Ua: NEGATIVE
Nitrite: NEGATIVE
Protein, ur: NEGATIVE mg/dL
Specific Gravity, Urine: 1.019 (ref 1.005–1.030)
pH: 5 (ref 5.0–8.0)

## 2020-05-02 LAB — LIPASE, BLOOD: Lipase: 32 U/L (ref 11–51)

## 2020-05-02 LAB — I-STAT BETA HCG BLOOD, ED (MC, WL, AP ONLY): I-stat hCG, quantitative: <5 m[IU]/mL (ref <5)

## 2020-05-02 MED ORDER — IOHEXOL 300 MG/ML  SOLN
100.0000 mL | Freq: Once | INTRAMUSCULAR | Status: AC | PRN
  Administered 2020-05-02: 100 mL via INTRAVENOUS

## 2020-05-02 MED ORDER — SODIUM CHLORIDE 0.9 % IV BOLUS
1000.0000 mL | Freq: Once | INTRAVENOUS | Status: AC
  Administered 2020-05-02: 1000 mL via INTRAVENOUS

## 2020-05-02 MED ORDER — CIPROFLOXACIN IN D5W 400 MG/200ML IV SOLN
400.0000 mg | Freq: Once | INTRAVENOUS | Status: DC
  Administered 2020-05-03: 400 mg via INTRAVENOUS
  Filled 2020-05-02: qty 200

## 2020-05-02 MED ORDER — METRONIDAZOLE IN NACL 5-0.79 MG/ML-% IV SOLN
500.0000 mg | Freq: Once | INTRAVENOUS | Status: AC
  Administered 2020-05-03: 500 mg via INTRAVENOUS
  Filled 2020-05-02: qty 100

## 2020-05-02 NOTE — ED Notes (Signed)
Pt to CT via stretcher

## 2020-05-02 NOTE — ED Notes (Signed)
Lab called to add on urine culture.  ?

## 2020-05-02 NOTE — ED Notes (Signed)
Pelvic setup at bedside.

## 2020-05-02 NOTE — ED Triage Notes (Signed)
Pt reports RLQ abdominal pain that radiates to umbilicus beginning this morning.

## 2020-05-02 NOTE — ED Provider Notes (Signed)
[facility]   CSN: [id number] Arrival date & time: 05/02/20  2136     History Chief Complaint  Patient presents with  . Abdominal Pain    Jodi York is a 25 y.o. female who presents today for evaluation of RLQ abdominal pain.  She is almost 2 months postpartum of vaginal delivery that was uncomplicated and has been cleared by her OB/GYN. She states that today she started having pain around her umbilicus.  This has since settled into the right lower abdomen.  She denies any abnormal vaginal bleeding or discharge.  She is not breast-feeding.  She states that she had 3 episodes of diarrhea today.  No blood in her diarrhea.  She denies N/V.  She denies concern for STI.  She has never had prior abdominal surgeries.   HPI     Past Medical History:  Diagnosis Date  . Medical history non-contributory     Patient Active Problem List   Diagnosis Date Noted  . Premature rupture of membranes (PROM) affecting second pregnancy 03/14/2020    Past Surgical History:  Procedure Laterality Date  . TONSILLECTOMY       OB History    Gravida  2   Para  2   Term  2   Preterm      AB      Living  2     SAB      IAB      Ectopic      Multiple  0   Live Births  2           Family History  Problem Relation Age of Onset  . Hypertension Father     Social History   Tobacco Use  . Smoking status: Never Smoker  . Smokeless tobacco: Never Used  Vaping Use  . Vaping Use: Never used  Substance Use Topics  . Alcohol use: No  . Drug use: No    Home Medications Prior to Admission medications   Medication Sig Start Date End Date Taking? Authorizing Provider  Prenatal Vit-Fe Fumarate-FA (PRENATAL MULTIVITAMIN) TABS tablet Take 1 tablet by mouth daily at 12 noon.    [provider]    Allergies    Shellfish allergy, Penicillins, and Pollen extract  Review of Systems   Review of Systems  Constitutional:  Negative for chills and fever.  HENT: Negative for congestion.   Eyes: Negative for visual disturbance.  Respiratory: Negative for chest tightness and shortness of breath.   Cardiovascular: Negative for chest pain.  Gastrointestinal: Positive for abdominal pain and diarrhea. Negative for blood in stool, nausea and vomiting.  Genitourinary: Negative for dysuria, frequency and urgency.  Musculoskeletal: Negative for back pain and neck pain.  Skin: Negative for color change.  Neurological: Negative for weakness and headaches.  Psychiatric/Behavioral: Negative for confusion.  All other systems reviewed and are negative.   Physical Exam Updated Vital Signs BP (!) 119/92   Pulse 81   Temp 98.4 F (36.9 C)   Resp 16   LMP 06/05/2019   SpO2 100%   Physical Exam Vitals and nursing note reviewed. Exam conducted with a chaperone present (Female ED tech).  Constitutional:      General: She is not in acute distress.    Appearance: She is not diaphoretic.  HENT:     Head: Normocephalic and atraumatic.  Eyes:     General: No scleral icterus.       Right eye: No discharge.  Left eye: No discharge.     Conjunctiva/sclera: Conjunctivae normal.  Cardiovascular:     Rate and Rhythm: Normal rate and regular rhythm.  Pulmonary:     Effort: Pulmonary effort is normal. No respiratory distress.     Breath sounds: No stridor.  Abdominal:     General: There is no distension.     Tenderness: There is abdominal tenderness in the right lower quadrant. There is guarding and rebound.  Genitourinary:    Vagina: Normal. No vaginal discharge or bleeding.     Cervix: No cervical motion tenderness or discharge.     Uterus: Not tender.      Adnexa:        Right: No mass, tenderness or fullness.         Left: No mass, tenderness or fullness.    Musculoskeletal:        General: No deformity.     Cervical back: Normal range of motion.  Skin:    General: Skin is warm and dry.  Neurological:      Mental Status: She is alert.     Motor: No abnormal muscle tone.     Comments: Patient is awake and alert. Answers questions appropriately.   Psychiatric:        Mood and Affect: Mood normal.        Behavior: Behavior normal.     ED Results / Procedures / Treatments   Labs (all labs ordered are listed, but only abnormal results are displayed) Labs Reviewed  WET PREP, GENITAL - Abnormal; Notable for the following components:      Result Value   WBC, Wet Prep HPF POC MODERATE (*)    All other components within normal limits  CBC WITH DIFFERENTIAL/PLATELET - Abnormal; Notable for the following components:   WBC 11.0 (*)    Hemoglobin 11.2 (*)    HCT 35.4 (*)    All other components within normal limits  RESP PANEL BY RT-PCR (FLU A&B, COVID) ARPGX2  CULTURE, BLOOD (ROUTINE X 2)  CULTURE, BLOOD (ROUTINE X 2)  URINE CULTURE  COMPREHENSIVE METABOLIC PANEL  URINALYSIS, ROUTINE W REFLEX MICROSCOPIC  LIPASE, BLOOD  LACTIC ACID, PLASMA  LACTIC ACID, PLASMA  I-STAT BETA HCG BLOOD, ED ([facility], [facility], [facility] ONLY)  GC/CHLAMYDIA PROBE AMP ([place]) NOT AT [facility]    EKG None  Radiology CT ABDOMEN PELVIS W CONTRAST  Result Date: 05/02/2020 CLINICAL DATA:  Right lower quadrant abdominal pain. Radiates to umbilicus beginning this morning. EXAM: CT ABDOMEN AND PELVIS WITH CONTRAST TECHNIQUE: Multidetector CT imaging of the abdomen and pelvis was performed using the standard protocol following bolus administration of intravenous contrast. CONTRAST:  OMNIPAQUE IOHEXOL 300 MG/ML  SOLN COMPARISON:  Ultrasound abdomen 06/04/2016 FINDINGS: Lower chest: No acute abnormality. Hepatobiliary: No focal liver abnormality. The gallbladder is contracted. No gallstones, gallbladder wall thickening, or pericholecystic fluid. No biliary dilatation. Pancreas: No focal lesion. Normal pancreatic contour. No surrounding inflammatory changes. No main pancreatic ductal dilatation. Spleen: Normal in size without focal  abnormality. Adrenals/Urinary Tract: No adrenal nodule bilaterally. Bilateral kidneys enhance symmetrically. No hydronephrosis. No hydroureter. The urinary bladder is unremarkable. Stomach/Bowel: Stomach is within normal limits. No evidence of bowel wall thickening or dilatation. The appendix is enlarged in caliber measuring up to 1.6 cm with associated appendiceal wall thickening and periappendiceal fat stranding. No appendiceal wall discontinuity identified. No definite appendicolith identified Vascular/Lymphatic: No abdominal aorta or iliac aneurysm. No abdominal, pelvic, or inguinal lymphadenopathy. Reproductive: Uterus and bilateral adnexa are  unremarkable. Other: No intraperitoneal free fluid. No intraperitoneal free gas. No organized fluid collection. Musculoskeletal: No abdominal wall hernia or abnormality. No suspicious lytic or blastic osseous lesions. No acute displaced fracture. IMPRESSION: Non-perforated acute appendicitis. No definite appendicolith identified. Electronically Signed   By: Tish Frederickson M.D.   On: 05/02/2020 23:44    Procedures Procedures   Medications Ordered in ED Medications  ciprofloxacin (CIPRO) IVPB 400 mg (has no administration in time range)    And  metroNIDAZOLE (FLAGYL) IVPB 500 mg (has no administration in time range)  morphine 4 MG/ML injection 4 mg (has no administration in time range)  ondansetron (ZOFRAN) injection 4 mg (has no administration in time range)  sodium chloride 0.9 % bolus 1,000 mL (1,000 mLs Intravenous New Bag/Given 05/02/20 2207)  iohexol (OMNIPAQUE) 300 MG/ML solution 100 mL (100 mLs Intravenous Contrast Given 05/02/20 2322)    ED Course  I have reviewed the triage vital signs and the nursing notes.  Pertinent labs & imaging results that were available during my care of the patient were reviewed by me and considered in my medical decision making (see chart for details).  Clinical Course as of 05/03/20 0008  Mon May 03, 2020  0003 I  spoke with Dr. Donell Beers who will place admission orders.  Patient can have clears until 2 AM. [EH]    Clinical Course User Index [EH] Cristina Gong, PA-C   MDM Rules/Calculators/A&P                           Patient is a 25 year old woman who presents today for evaluation of 1 day of periumbilical pain that is settled into her right lower quadrant.  On exam she has right lower quadrant abdominal pain.  Her white count is slightly elevated at 11.  Hemoglobin is slightly low at 11.2.  Here her heart rate is over 90 and she does have slight leukocytosis, however I suspect that the slightly elevated heart rate represents dehydration from her multiple episodes of diarrhea rather than sepsis.  Out of abundance of caution blood and urine cultures are sent however code sepsis is not called because I suspect dehydration for the reason behind her tachycardia.  Pelvic exam was performed with chaperone which was unremarkable.  CT abdomen pelvis showed acute nonperforated appendicitis without a clear appendicolith.  As patient is penicillin allergic she is given Cipro Flagyl.  This is in line with the antibiotics that she would have gotten if she had been called a code sepsis.  Pain and nausea medicine are ordered as it is fluids. I spoke with Dr. Donell Beers who will admit the patient.  The patient appears reasonably stabilized for admission considering the current resources, flow, and capabilities available in the ED at this time, and I doubt any other [facility] requiring further screening and/or treatment in the ED prior to admission assuming timely admission and bed placement.  Note: Portions of this report may have been transcribed using voice recognition software. Every effort was made to ensure accuracy; however, inadvertent computerized transcription errors may be present   Final Clinical Impression(s) / ED Diagnoses Final diagnoses:  Acute appendicitis, unspecified acute appendicitis type  Right  lower quadrant abdominal pain    Rx / DC Orders ED Discharge Orders    None       Norman Clay 05/03/20 0014    Bethann Berkshire, MD 05/03/20 1034

## 2020-05-03 ENCOUNTER — Encounter (HOSPITAL_COMMUNITY): Payer: Self-pay

## 2020-05-03 ENCOUNTER — Encounter (HOSPITAL_COMMUNITY)
Admission: EM | Disposition: A | Discharge status: Home / Self Care | Payer: Self-pay | Source: Home / Self Care | Attending: Emergency Medicine

## 2020-05-03 ENCOUNTER — Observation Stay (HOSPITAL_COMMUNITY): Payer: Medicaid Other | Admitting: Anesthesiology

## 2020-05-03 DIAGNOSIS — Z20822 Contact with and (suspected) exposure to covid-19: Secondary | ICD-10-CM | POA: Diagnosis not present

## 2020-05-03 DIAGNOSIS — K3580 Unspecified acute appendicitis: Secondary | ICD-10-CM | POA: Diagnosis present

## 2020-05-03 HISTORY — PX: LAPAROSCOPIC APPENDECTOMY: SHX408

## 2020-05-03 LAB — RESP PANEL BY RT-PCR (FLU A&B, COVID) ARPGX2
Influenza A by PCR: NEGATIVE
Influenza B by PCR: NEGATIVE
SARS Coronavirus 2 by RT PCR: NEGATIVE

## 2020-05-03 LAB — LACTIC ACID, PLASMA: Lactic Acid, Venous: 0.7 mmol/L (ref 0.5–1.9)

## 2020-05-03 LAB — GC/CHLAMYDIA PROBE AMP (~~LOC~~) NOT AT ARMC
Chlamydia: NEGATIVE
Comment: NEGATIVE
Comment: NORMAL
Neisseria Gonorrhea: NEGATIVE

## 2020-05-03 LAB — CBC
HCT: 33.7 % — ABNORMAL LOW (ref 36.0–46.0)
Hemoglobin: 10.5 g/dL — ABNORMAL LOW (ref 12.0–15.0)
MCH: 27.1 pg (ref 26.0–34.0)
MCHC: 31.2 g/dL (ref 30.0–36.0)
MCV: 86.9 fL (ref 80.0–100.0)
Platelets: 243 10*3/uL (ref 150–400)
RBC: 3.88 MIL/uL (ref 3.87–5.11)
RDW: 14.8 % (ref 11.5–15.5)
WBC: 8 10*3/uL (ref 4.0–10.5)
nRBC: 0 % (ref 0.0–0.2)

## 2020-05-03 LAB — HIV ANTIBODY (ROUTINE TESTING W REFLEX): HIV Screen 4th Generation wRfx: NONREACTIVE

## 2020-05-03 LAB — SURGICAL PCR SCREEN
MRSA, PCR: NEGATIVE
Staphylococcus aureus: NEGATIVE

## 2020-05-03 SURGERY — APPENDECTOMY, LAPAROSCOPIC
Anesthesia: General

## 2020-05-03 MED ORDER — SIMETHICONE 80 MG PO CHEW
40.0000 mg | CHEWABLE_TABLET | Freq: Four times a day (QID) | ORAL | Status: DC | PRN
Start: 2020-05-03 — End: 2020-05-04

## 2020-05-03 MED ORDER — FENTANYL CITRATE (PF) 250 MCG/5ML IJ SOLN
INTRAMUSCULAR | Status: AC
  Filled 2020-05-03: qty 5

## 2020-05-03 MED ORDER — SUCCINYLCHOLINE CHLORIDE 200 MG/10ML IV SOSY
PREFILLED_SYRINGE | INTRAVENOUS | Status: AC
  Filled 2020-05-03: qty 10

## 2020-05-03 MED ORDER — FENTANYL CITRATE (PF) 100 MCG/2ML IJ SOLN
25.0000 ug | INTRAMUSCULAR | Status: DC | PRN
Start: 2020-05-03 — End: 2020-05-03
  Administered 2020-05-03: 50 ug via INTRAVENOUS

## 2020-05-03 MED ORDER — BUPIVACAINE HCL 0.25 % IJ SOLN
INTRAMUSCULAR | Status: AC
  Filled 2020-05-03: qty 1

## 2020-05-03 MED ORDER — ROCURONIUM BROMIDE 100 MG/10ML IV SOLN
INTRAVENOUS | Status: DC | PRN
  Administered 2020-05-03: 40 mg via INTRAVENOUS

## 2020-05-03 MED ORDER — PROCHLORPERAZINE MALEATE 10 MG PO TABS
10.0000 mg | ORAL_TABLET | Freq: Four times a day (QID) | ORAL | Status: DC | PRN
  Filled 2020-05-03: qty 1

## 2020-05-03 MED ORDER — ACETAMINOPHEN 325 MG PO TABS
650.0000 mg | ORAL_TABLET | Freq: Four times a day (QID) | ORAL | Status: DC | PRN
  Administered 2020-05-03: 650 mg via ORAL
  Filled 2020-05-03: qty 2

## 2020-05-03 MED ORDER — LIDOCAINE 2% (20 MG/ML) 5 ML SYRINGE
INTRAMUSCULAR | Status: AC
  Filled 2020-05-03: qty 5

## 2020-05-03 MED ORDER — ACETAMINOPHEN 325 MG PO TABS: 650.0000 mg | ORAL_TABLET | Freq: Four times a day (QID) | ORAL | Status: AC | PRN

## 2020-05-03 MED ORDER — 0.9 % SODIUM CHLORIDE (POUR BTL) OPTIME
TOPICAL | Status: DC | PRN
  Administered 2020-05-03: 1000 mL

## 2020-05-03 MED ORDER — IBUPROFEN 200 MG PO TABS: 400.0000 mg | ORAL_TABLET | Freq: Four times a day (QID) | ORAL | Status: DC | PRN

## 2020-05-03 MED ORDER — ROCURONIUM BROMIDE 10 MG/ML (PF) SYRINGE
PREFILLED_SYRINGE | INTRAVENOUS | Status: AC
  Filled 2020-05-03: qty 10

## 2020-05-03 MED ORDER — CIPROFLOXACIN IN D5W 400 MG/200ML IV SOLN
400.0000 mg | Freq: Two times a day (BID) | INTRAVENOUS | Status: DC
  Administered 2020-05-03 – 2020-05-04 (×2): 400 mg via INTRAVENOUS
  Filled 2020-05-03 (×3): qty 200

## 2020-05-03 MED ORDER — MORPHINE SULFATE (PF) 4 MG/ML IV SOLN
4.0000 mg | Freq: Once | INTRAVENOUS | Status: AC
  Administered 2020-05-03: 4 mg via INTRAVENOUS
  Filled 2020-05-03: qty 1

## 2020-05-03 MED ORDER — KETOROLAC TROMETHAMINE 30 MG/ML IJ SOLN
INTRAMUSCULAR | Status: AC
  Filled 2020-05-03: qty 1

## 2020-05-03 MED ORDER — LIDOCAINE 2% (20 MG/ML) 5 ML SYRINGE
INTRAMUSCULAR | Status: DC | PRN
  Administered 2020-05-03: 100 mg via INTRAVENOUS

## 2020-05-03 MED ORDER — PROPOFOL 10 MG/ML IV BOLUS
INTRAVENOUS | Status: AC
  Filled 2020-05-03: qty 20

## 2020-05-03 MED ORDER — FENTANYL CITRATE (PF) 100 MCG/2ML IJ SOLN
INTRAMUSCULAR | Status: AC
  Filled 2020-05-03: qty 2

## 2020-05-03 MED ORDER — LACTATED RINGERS IV SOLN: INTRAVENOUS | Status: DC

## 2020-05-03 MED ORDER — ONDANSETRON HCL 4 MG/2ML IJ SOLN: 4.0000 mg | Freq: Four times a day (QID) | INTRAMUSCULAR | Status: DC | PRN

## 2020-05-03 MED ORDER — SUCCINYLCHOLINE CHLORIDE 200 MG/10ML IV SOSY
PREFILLED_SYRINGE | INTRAVENOUS | Status: DC | PRN
  Administered 2020-05-03: 100 mg via INTRAVENOUS

## 2020-05-03 MED ORDER — AMISULPRIDE (ANTIEMETIC) 5 MG/2ML IV SOLN: 10.0000 mg | Freq: Once | INTRAVENOUS | Status: DC | PRN

## 2020-05-03 MED ORDER — ONDANSETRON HCL 4 MG/2ML IJ SOLN
INTRAMUSCULAR | Status: AC
  Filled 2020-05-03: qty 2

## 2020-05-03 MED ORDER — LACTATED RINGERS IV SOLN
INTRAVENOUS | Status: AC | PRN
  Administered 2020-05-03: 1000 mL

## 2020-05-03 MED ORDER — MIDAZOLAM HCL 2 MG/2ML IJ SOLN
INTRAMUSCULAR | Status: AC
  Filled 2020-05-03: qty 2

## 2020-05-03 MED ORDER — DEXAMETHASONE SODIUM PHOSPHATE 10 MG/ML IJ SOLN
INTRAMUSCULAR | Status: DC | PRN
  Administered 2020-05-03: 8 mg via INTRAVENOUS

## 2020-05-03 MED ORDER — KETOROLAC TROMETHAMINE 30 MG/ML IJ SOLN
INTRAMUSCULAR | Status: DC | PRN
  Administered 2020-05-03: 30 mg via INTRAVENOUS

## 2020-05-03 MED ORDER — BUPIVACAINE HCL 0.25 % IJ SOLN
INTRAMUSCULAR | Status: DC | PRN
  Administered 2020-05-03: 10 mL

## 2020-05-03 MED ORDER — METRONIDAZOLE IN NACL 5-0.79 MG/ML-% IV SOLN
500.0000 mg | Freq: Three times a day (TID) | INTRAVENOUS | Status: DC
  Administered 2020-05-03 – 2020-05-04 (×3): 500 mg via INTRAVENOUS
  Filled 2020-05-03 (×3): qty 100

## 2020-05-03 MED ORDER — KCL IN DEXTROSE-NACL 20-5-0.45 MEQ/L-%-% IV SOLN
INTRAVENOUS | Status: DC
  Filled 2020-05-03 (×3): qty 1000

## 2020-05-03 MED ORDER — HYDROMORPHONE HCL 1 MG/ML IJ SOLN: 0.5000 mg | INTRAMUSCULAR | Status: DC | PRN

## 2020-05-03 MED ORDER — OXYCODONE HCL 5 MG PO TABS: 5.0000 mg | ORAL_TABLET | Freq: Once | ORAL | Status: DC | PRN

## 2020-05-03 MED ORDER — ONDANSETRON HCL 4 MG/2ML IJ SOLN: 4.0000 mg | Freq: Once | INTRAMUSCULAR | Status: DC | PRN

## 2020-05-03 MED ORDER — PRENATAL MULTIVITAMIN CH
1.0000 | ORAL_TABLET | Freq: Every day | ORAL | Status: DC
  Filled 2020-05-03 (×2): qty 1

## 2020-05-03 MED ORDER — ONDANSETRON HCL 4 MG/2ML IJ SOLN
INTRAMUSCULAR | Status: DC | PRN
  Administered 2020-05-03 (×2): 4 mg via INTRAVENOUS

## 2020-05-03 MED ORDER — FENTANYL CITRATE (PF) 100 MCG/2ML IJ SOLN
INTRAMUSCULAR | Status: DC | PRN
  Administered 2020-05-03 (×4): 50 ug via INTRAVENOUS

## 2020-05-03 MED ORDER — TRAMADOL HCL 50 MG PO TABS
50.0000 mg | ORAL_TABLET | Freq: Four times a day (QID) | ORAL | Status: DC | PRN
  Administered 2020-05-03: 50 mg via ORAL
  Filled 2020-05-03: qty 1

## 2020-05-03 MED ORDER — MIDAZOLAM HCL 5 MG/5ML IJ SOLN
INTRAMUSCULAR | Status: DC | PRN
  Administered 2020-05-03: 2 mg via INTRAVENOUS

## 2020-05-03 MED ORDER — DIPHENHYDRAMINE HCL 50 MG/ML IJ SOLN: 12.5000 mg | Freq: Four times a day (QID) | INTRAMUSCULAR | Status: DC | PRN

## 2020-05-03 MED ORDER — OXYCODONE HCL 5 MG PO TABS
5.0000 mg | ORAL_TABLET | ORAL | Status: DC | PRN
  Administered 2020-05-03: 10 mg via ORAL
  Filled 2020-05-03: qty 2
  Filled 2020-05-03: qty 1

## 2020-05-03 MED ORDER — DEXAMETHASONE SODIUM PHOSPHATE 10 MG/ML IJ SOLN
INTRAMUSCULAR | Status: AC
  Filled 2020-05-03: qty 1

## 2020-05-03 MED ORDER — PRENATAL MULTIVITAMIN CH: 1.0000 | ORAL_TABLET | Freq: Every day | ORAL | Status: AC

## 2020-05-03 MED ORDER — DIPHENHYDRAMINE HCL 12.5 MG/5ML PO ELIX: 12.5000 mg | ORAL_SOLUTION | Freq: Four times a day (QID) | ORAL | Status: DC | PRN

## 2020-05-03 MED ORDER — SCOPOLAMINE 1 MG/3DAYS TD PT72
1.0000 | MEDICATED_PATCH | TRANSDERMAL | Status: DC
  Administered 2020-05-03: 1.5 mg via TRANSDERMAL
  Filled 2020-05-03: qty 1

## 2020-05-03 MED ORDER — ONDANSETRON HCL 4 MG/2ML IJ SOLN
4.0000 mg | Freq: Once | INTRAMUSCULAR | Status: AC
  Administered 2020-05-03: 4 mg via INTRAVENOUS
  Filled 2020-05-03: qty 2

## 2020-05-03 MED ORDER — OXYCODONE HCL 5 MG PO TABS: 5.0000 mg | ORAL_TABLET | Freq: Four times a day (QID) | ORAL | 0 refills | Status: DC | PRN

## 2020-05-03 MED ORDER — OXYCODONE HCL 5 MG/5ML PO SOLN: 5.0000 mg | Freq: Once | ORAL | Status: DC | PRN

## 2020-05-03 MED ORDER — MELATONIN 3 MG PO TABS: 3.0000 mg | ORAL_TABLET | Freq: Every evening | ORAL | Status: DC | PRN

## 2020-05-03 MED ORDER — PROPOFOL 10 MG/ML IV BOLUS
INTRAVENOUS | Status: DC | PRN
  Administered 2020-05-03: 200 mg via INTRAVENOUS

## 2020-05-03 MED ORDER — ACETAMINOPHEN 650 MG RE SUPP: 650.0000 mg | Freq: Four times a day (QID) | RECTAL | Status: DC | PRN

## 2020-05-03 MED ORDER — SUGAMMADEX SODIUM 200 MG/2ML IV SOLN
INTRAVENOUS | Status: DC | PRN
  Administered 2020-05-03: 200 mg via INTRAVENOUS

## 2020-05-03 MED ORDER — CHLORHEXIDINE GLUCONATE 0.12 % MT SOLN
15.0000 mL | Freq: Once | OROMUCOSAL | Status: AC
  Administered 2020-05-03: 15 mL via OROMUCOSAL

## 2020-05-03 MED ORDER — ENOXAPARIN SODIUM 40 MG/0.4ML ~~LOC~~ SOLN
40.0000 mg | SUBCUTANEOUS | Status: DC
  Administered 2020-05-04: 40 mg via SUBCUTANEOUS
  Filled 2020-05-03: qty 0.4

## 2020-05-03 MED ORDER — PROCHLORPERAZINE EDISYLATE 10 MG/2ML IJ SOLN: 5.0000 mg | Freq: Four times a day (QID) | INTRAMUSCULAR | Status: DC | PRN

## 2020-05-03 MED ORDER — ONDANSETRON 4 MG PO TBDP
4.0000 mg | ORAL_TABLET | Freq: Four times a day (QID) | ORAL | Status: DC | PRN
  Administered 2020-05-03 (×2): 4 mg via ORAL
  Filled 2020-05-03 (×2): qty 1

## 2020-05-03 SURGICAL SUPPLY — 42 items
CABLE HIGH FREQUENCY MONO STRZ (ELECTRODE) IMPLANT
CHLORAPREP W/TINT 26 (MISCELLANEOUS) ×2 IMPLANT
COVER SURGICAL LIGHT HANDLE (MISCELLANEOUS) ×2 IMPLANT
COVER WAND RF STERILE (DRAPES) IMPLANT
CUTTER FLEX LINEAR 45M (STAPLE) ×2 IMPLANT
DECANTER SPIKE VIAL GLASS SM (MISCELLANEOUS) ×2 IMPLANT
DERMABOND ADVANCED (GAUZE/BANDAGES/DRESSINGS) ×1
DERMABOND ADVANCED .7 DNX12 (GAUZE/BANDAGES/DRESSINGS) ×1 IMPLANT
DEVICE TROCAR PUNCTURE CLOSURE (ENDOMECHANICALS) ×2 IMPLANT
DRAPE LAPAROSCOPIC ABDOMINAL (DRAPES) ×2 IMPLANT
ELECT REM PT RETURN 15FT ADLT (MISCELLANEOUS) ×2 IMPLANT
GLOVE SURG ENC MOIS LTX SZ7 (GLOVE) ×4 IMPLANT
GLOVE SURG UNDER POLY LF SZ7 (GLOVE) ×2 IMPLANT
GLOVE SURG UNDER POLY LF SZ7.5 (GLOVE) ×4 IMPLANT
GOWN STRL REUS W/ TWL XL LVL3 (GOWN DISPOSABLE) ×1 IMPLANT
GOWN STRL REUS W/TWL LRG LVL3 (GOWN DISPOSABLE) ×4 IMPLANT
GOWN STRL REUS W/TWL XL LVL3 (GOWN DISPOSABLE) ×3 IMPLANT
GRASPER SUT TROCAR 14GX15 (MISCELLANEOUS) ×2 IMPLANT
KIT BASIN OR (CUSTOM PROCEDURE TRAY) ×2 IMPLANT
KIT TURNOVER KIT A (KITS) ×2 IMPLANT
NS IRRIG 1000ML POUR BTL (IV SOLUTION) ×2 IMPLANT
PENCIL SMOKE EVACUATOR (MISCELLANEOUS) IMPLANT
POUCH RETRIEVAL ECOSAC 10 (ENDOMECHANICALS) ×1 IMPLANT
POUCH RETRIEVAL ECOSAC 10MM (ENDOMECHANICALS) ×1
RELOAD 45 VASCULAR/THIN (ENDOMECHANICALS) IMPLANT
RELOAD STAPLE TA45 3.5 REG BLU (ENDOMECHANICALS) ×4 IMPLANT
SET IRRIG TUBING LAPAROSCOPIC (IRRIGATION / IRRIGATOR) ×2 IMPLANT
SET TUBE SMOKE EVAC HIGH FLOW (TUBING) ×2 IMPLANT
SHEARS HARMONIC ACE PLUS 36CM (ENDOMECHANICALS) ×2 IMPLANT
SLEEVE XCEL OPT CAN 5 100 (ENDOMECHANICALS) ×2 IMPLANT
SOL ANTI FOG 6CC (MISCELLANEOUS) ×1 IMPLANT
SOLUTION ANTI FOG 6CC (MISCELLANEOUS) ×1
SUT MNCRL AB 4-0 PS2 18 (SUTURE) ×2 IMPLANT
SUT VICRYL 0 ENDOLOOP (SUTURE) IMPLANT
SUT VICRYL 0 UR6 27IN ABS (SUTURE) ×2 IMPLANT
TOWEL OR 17X26 10 PK STRL BLUE (TOWEL DISPOSABLE) ×2 IMPLANT
TOWEL OR NON WOVEN STRL DISP B (DISPOSABLE) ×2 IMPLANT
TRAY FOLEY MTR SLVR 14FR STAT (SET/KITS/TRAYS/PACK) ×2 IMPLANT
TRAY FOLEY MTR SLVR 16FR STAT (SET/KITS/TRAYS/PACK) IMPLANT
TRAY LAPAROSCOPIC (CUSTOM PROCEDURE TRAY) ×2 IMPLANT
TROCAR XCEL BLUNT TIP 100MML (ENDOMECHANICALS) ×2 IMPLANT
TROCAR XCEL NON-BLD 5MMX100MML (ENDOMECHANICALS) ×2 IMPLANT

## 2020-05-03 NOTE — ED Notes (Signed)
Pt IV unintentionally removed after CT scan.

## 2020-05-03 NOTE — Progress Notes (Signed)
Patient states she would feel more comfortable staying another night because of feeling dizzy when walking and having nausea. PA made aware. Discharge canceled.

## 2020-05-03 NOTE — Anesthesia Postprocedure Evaluation (Signed)
Anesthesia Post Note  Patient: Dajon Rowe  Procedure(s) Performed: APPENDECTOMY LAPAROSCOPIC (N/A )     Patient location during evaluation: PACU Anesthesia Type: General Level of consciousness: awake and alert Pain management: pain level controlled Vital Signs Assessment: post-procedure vital signs reviewed and stable Respiratory status: spontaneous breathing, nonlabored ventilation and respiratory function stable Cardiovascular status: blood pressure returned to baseline and stable Postop Assessment: no apparent nausea or vomiting Anesthetic complications: no   No complications documented.  Last Vitals:  Vitals:   05/03/20 1042 05/03/20 1045  BP:  (!) 142/80  Pulse: 66 66  Resp: 12 10  Temp:    SpO2: 100% 100%    Last Pain:  Vitals:   05/03/20 1112  TempSrc:   PainSc: 6                  Lucretia Kern

## 2020-05-03 NOTE — H&P (Signed)
Jodi York is an 25 y.o. female.   Chief Complaint: rlq pain HPI: 58 yof who began yesterday am having rlq abdominal pain.  This worsened and nothing she was doing at home was helping.  No n/v.  No fevers.  Never had this before.  She is one month postpartum right now.  She had some loose stools.  She has no prior abdominal surgery.  She has ct scan with acute appendicitis.     Past Medical History:  Diagnosis Date  . Medical history non-contributory     Past Surgical History:  Procedure Laterality Date  . TONSILLECTOMY      Family History  Problem Relation Age of Onset  . Hypertension Father    Social History:  reports that she has never smoked. She has never used smokeless tobacco. She reports that she does not drink alcohol and does not use drugs.  Allergies:  Allergies  Allergen Reactions  . Shellfish Allergy Anaphylaxis  . Penicillins Nausea And Vomiting and Swelling    Has patient had a PCN reaction causing immediate rash, facial/tongue/throat swelling, SOB or lightheadedness with hypotension: Yes Has patient had a PCN reaction causing severe rash involving mucus membranes or skin necrosis: No Has patient had a PCN reaction that required hospitalization No Has patient had a PCN reaction occurring within the last 10 years: Yes If all of the above answers are "NO", then may proceed with Cephalosporin use.  Has patient had a PCN reaction causing immediate rash, facial/tongue/throat swelling, SOB or lightheadedness with hypotension: Yes Has patient had a PCN reaction causing severe rash involving mucus membranes or skin necrosis: No Has patient had a PCN reaction that required hospitalization No Has patient had a PCN reaction occurring within the last 10 years: Yes If all of the above answers are "NO", then may proceed with Cephalosporin use.  . Pollen Extract Other (See Comments)    Eyes get puffy and face swells    No medications prior to admission.    Results for  orders placed or performed during the hospital encounter of 05/02/20 (from the past 48 hour(s))  CBC with Differential     Status: Abnormal   Collection Time: 05/02/20  9:55 PM  Result Value Ref Range   WBC 11.0 (H) 4.0 - 10.5 K/uL   RBC 4.13 3.87 - 5.11 MIL/uL   Hemoglobin 11.2 (L) 12.0 - 15.0 g/dL   HCT 70.3 (L) 50.0 - 93.8 %   MCV 85.7 80.0 - 100.0 fL   MCH 27.1 26.0 - 34.0 pg   MCHC 31.6 30.0 - 36.0 g/dL   RDW 18.2 99.3 - 71.6 %   Platelets 277 150 - 400 K/uL   nRBC 0.0 0.0 - 0.2 %   Neutrophils Relative % 64 %   Neutro Abs 7.1 1.7 - 7.7 K/uL   Lymphocytes Relative 27 %   Lymphs Abs 3.0 0.7 - 4.0 K/uL   Monocytes Relative 6 %   Monocytes Absolute 0.7 0.1 - 1.0 K/uL   Eosinophils Relative 2 %   Eosinophils Absolute 0.2 0.0 - 0.5 K/uL   Basophils Relative 0 %   Basophils Absolute 0.0 0.0 - 0.1 K/uL   Immature Granulocytes 1 %   Abs Immature Granulocytes 0.06 0.00 - 0.07 K/uL    Comment: Performed at [facility], [address], [place], [place] [address]  Comprehensive metabolic panel     Status: None   Collection Time: 05/02/20  9:55 PM  Result Value Ref Range  Sodium 139 135 - 145 mmol/L   Potassium 3.9 3.5 - 5.1 mmol/L   Chloride 108 98 - 111 mmol/L   CO2 24 22 - 32 mmol/L   Glucose, Bld 88 70 - 99 mg/dL    Comment: Glucose reference range applies only to samples taken after fasting for at least 8 hours.   BUN 18 6 - 20 mg/dL   Creatinine, Ser 4.09 0.44 - 1.00 mg/dL   Calcium 9.1 8.9 - 81.1 mg/dL   Total Protein 7.7 6.5 - 8.1 g/dL   Albumin 3.6 3.5 - 5.0 g/dL   AST 16 15 - 41 U/L   ALT 16 0 - 44 U/L   Alkaline Phosphatase 64 38 - 126 U/L   Total Bilirubin 0.7 0.3 - 1.2 mg/dL   GFR, Estimated >91 >47 mL/min    Comment: (NOTE) Calculated using the CKD-EPI Creatinine Equation (2021)    Anion gap 7 5 - 15    Comment: Performed at [facility], [address], [place], [place] [address]  Urinalysis, Routine w reflex  microscopic Urine, Clean Catch     Status: None   Collection Time: 05/02/20  9:55 PM  Result Value Ref Range   Color, Urine YELLOW YELLOW   APPearance CLEAR CLEAR   Specific Gravity, Urine 1.019 1.005 - 1.030   pH 5.0 5.0 - 8.0   Glucose, UA NEGATIVE NEGATIVE mg/dL   Hgb urine dipstick NEGATIVE NEGATIVE   Bilirubin Urine NEGATIVE NEGATIVE   Ketones, ur NEGATIVE NEGATIVE mg/dL   Protein, ur NEGATIVE NEGATIVE mg/dL   Nitrite NEGATIVE NEGATIVE   Leukocytes,Ua NEGATIVE NEGATIVE    Comment: Performed at [facility], [address], [place], [place] [address]  Lipase, blood     Status: None   Collection Time: 05/02/20  9:55 PM  Result Value Ref Range   Lipase 32 11 - 51 U/L    Comment: Performed at [facility], [address], [place], [place] [address]  I-Stat beta hCG blood, ED     Status: None   Collection Time: 05/02/20 10:18 PM  Result Value Ref Range   I-stat hCG, quantitative <5.0 <5 mIU/mL   Comment 3            Comment:   GEST. AGE      CONC.  (mIU/mL)   <=1 WEEK        5 - 50     2 WEEKS       50 - 500     3 WEEKS       100 - 10,000     4 WEEKS     1,000 - 30,000        FEMALE AND NON-PREGNANT FEMALE:     LESS THAN 5 mIU/mL   Wet prep, genital     Status: Abnormal   Collection Time: 05/02/20 10:54 PM   Specimen: Urine, Clean Catch  Result Value Ref Range   Yeast Wet Prep HPF POC NONE SEEN NONE SEEN   Trich, Wet Prep NONE SEEN NONE SEEN   Clue Cells Wet Prep HPF POC NONE SEEN NONE SEEN   WBC, Wet Prep HPF POC MODERATE (A) NONE SEEN   Sperm NONE SEEN     Comment: Performed at [facility], [address] [place], [place] [address]  Resp Panel by RT-PCR (Flu A&B, Covid) Nasopharyngeal Swab     Status: None   Collection Time: 05/02/20 11:20 PM   Specimen:  Nasopharyngeal Swab; Nasopharyngeal(NP) swabs in vial transport medium  Result Value Ref Range   SARS Coronavirus 2 by RT PCR NEGATIVE NEGATIVE    Comment:  (NOTE) SARS-CoV-2 target nucleic acids are NOT DETECTED.  The SARS-CoV-2 RNA is generally detectable in upper respiratory specimens during the acute phase of infection. The lowest concentration of SARS-CoV-2 viral copies this assay can detect is 138 copies/mL. A negative result does not preclude SARS-Cov-2 infection and should not be used as the sole basis for treatment or other patient management decisions. A negative result may occur with  improper specimen collection/handling, submission of specimen other than nasopharyngeal swab, presence of viral mutation(s) within the areas targeted by this assay, and inadequate number of viral copies(<138 copies/mL). A negative result must be combined with clinical observations, patient history, and epidemiological information. The expected result is Negative.  Fact Sheet for Patients:  BloggerCourse.com  Fact Sheet for Healthcare Providers:  SeriousBroker.it  This test is no t yet approved or cleared by the [place] FDA and  has been authorized for detection and/or diagnosis of SARS-CoV-2 by FDA under an Emergency Use Authorization (EUA). This EUA will remain  in effect (meaning this test can be used) for the duration of the COVID-19 declaration under Section 564(b)(1) of the Act, 21 U.S.C.section 360bbb-3(b)(1), unless the authorization is terminated  or revoked sooner.       Influenza A by PCR NEGATIVE NEGATIVE   Influenza B by PCR NEGATIVE NEGATIVE    Comment: (NOTE) The Xpert Xpress SARS-CoV-2/FLU/RSV plus assay is intended as an aid in the diagnosis of influenza from Nasopharyngeal swab specimens and should not be used as a sole basis for treatment. Nasal washings and aspirates are unacceptable for Xpert Xpress SARS-CoV-2/FLU/RSV testing.  Fact Sheet for Patients: BloggerCourse.com  Fact Sheet for Healthcare  Providers: SeriousBroker.it  This test is not yet approved or cleared by the [place] FDA and has been authorized for detection and/or diagnosis of SARS-CoV-2 by FDA under an Emergency Use Authorization (EUA). This EUA will remain in effect (meaning this test can be used) for the duration of the COVID-19 declaration under Section 564(b)(1) of the Act, 21 U.S.C. section 360bbb-3(b)(1), unless the authorization is terminated or revoked.  Performed at [facility], [address], [place], [place] [address]   Lactic acid, plasma     Status: None   Collection Time: 05/03/20 12:12 AM  Result Value Ref Range   Lactic Acid, Venous 0.7 0.5 - 1.9 mmol/L    Comment: Performed at [facility], [address], [place], [place] [address]  Surgical PCR screen     Status: None   Collection Time: 05/03/20  3:19 AM   Specimen: Nasal Mucosa; Nasal Swab  Result Value Ref Range   MRSA, PCR NEGATIVE NEGATIVE   Staphylococcus aureus NEGATIVE NEGATIVE    Comment: (NOTE) The Xpert SA Assay (FDA approved for NASAL specimens in patients 39 years of age and older), is one component of a comprehensive surveillance program. It is not intended to diagnose infection nor to guide or monitor treatment. Performed at [facility], [address] [place], [place] [address]   CBC     Status: Abnormal   Collection Time: 05/03/20  6:06 AM  Result Value Ref Range   WBC 8.0 4.0 - 10.5 K/uL   RBC 3.88 3.87 - 5.11 MIL/uL   Hemoglobin 10.5 (L) 12.0 - 15.0 g/dL   HCT 21.3 (L) 08.6 - 57.8 %  MCV 86.9 80.0 - 100.0 fL   MCH 27.1 26.0 - 34.0 pg   MCHC 31.2 30.0 - 36.0 g/dL   RDW 16.1 09.6 - 04.5 %   Platelets 243 150 - 400 K/uL   nRBC 0.0 0.0 - 0.2 %    Comment: Performed at [facility], [address], [place], [place] [address]   CT ABDOMEN PELVIS W CONTRAST  Result Date: 05/02/2020 CLINICAL DATA:  Right  lower quadrant abdominal pain. Radiates to umbilicus beginning this morning. EXAM: CT ABDOMEN AND PELVIS WITH CONTRAST TECHNIQUE: Multidetector CT imaging of the abdomen and pelvis was performed using the standard protocol following bolus administration of intravenous contrast. CONTRAST:  OMNIPAQUE IOHEXOL 300 MG/ML  SOLN COMPARISON:  Ultrasound abdomen 06/04/2016 FINDINGS: Lower chest: No acute abnormality. Hepatobiliary: No focal liver abnormality. The gallbladder is contracted. No gallstones, gallbladder wall thickening, or pericholecystic fluid. No biliary dilatation. Pancreas: No focal lesion. Normal pancreatic contour. No surrounding inflammatory changes. No main pancreatic ductal dilatation. Spleen: Normal in size without focal abnormality. Adrenals/Urinary Tract: No adrenal nodule bilaterally. Bilateral kidneys enhance symmetrically. No hydronephrosis. No hydroureter. The urinary bladder is unremarkable. Stomach/Bowel: Stomach is within normal limits. No evidence of bowel wall thickening or dilatation. The appendix is enlarged in caliber measuring up to 1.6 cm with associated appendiceal wall thickening and periappendiceal fat stranding. No appendiceal wall discontinuity identified. No definite appendicolith identified Vascular/Lymphatic: No abdominal aorta or iliac aneurysm. No abdominal, pelvic, or inguinal lymphadenopathy. Reproductive: Uterus and bilateral adnexa are unremarkable. Other: No intraperitoneal free fluid. No intraperitoneal free gas. No organized fluid collection. Musculoskeletal: No abdominal wall hernia or abnormality. No suspicious lytic or blastic osseous lesions. No acute displaced fracture. IMPRESSION: Non-perforated acute appendicitis. No definite appendicolith identified. Electronically Signed   By: Tish Frederickson M.D.   On: 05/02/2020 23:44    Review of Systems  Constitutional: Negative for fever.  Respiratory: Negative for shortness of breath.   Gastrointestinal:  Positive for abdominal pain. Negative for nausea and vomiting.  All other systems reviewed and are negative.   Blood pressure (!) 111/52, pulse 80, temperature 97.8 F (36.6 C), temperature source Oral, resp. rate 18, last menstrual period 06/05/2019, SpO2 100 %, unknown if currently breastfeeding. Physical Exam Constitutional:      Appearance: She is well-developed.  HENT:     Head: Normocephalic.  Eyes:     Pupils: Pupils are equal, round, and reactive to light.  Cardiovascular:     Rate and Rhythm: Normal rate and regular rhythm.     Heart sounds: Normal heart sounds.  Pulmonary:     Effort: Pulmonary effort is normal.     Breath sounds: Normal breath sounds.  Abdominal:     General: Bowel sounds are normal.     Tenderness: There is abdominal tenderness in the right lower quadrant.     Hernia: No hernia is present.  Skin:    General: Skin is warm and dry.     Capillary Refill: Capillary refill takes 2 to 3 seconds.  Neurological:     General: No focal deficit present.     Mental Status: She is alert.  Psychiatric:        Mood and Affect: Mood normal.        Behavior: Behavior normal.      Assessment/Plan Acute appendicitis -discussed pathophysiology of appendicitis -recommended lap appy- discussed surgery, risk and recovery -will proceed this am, possibly home later today -she is not breastfeeding currently  Emelia Loron,  MD 05/03/2020, 7:27 AM

## 2020-05-03 NOTE — Op Note (Signed)
Preoperative diagnosis: Acute appendicitis Postoperative diagnosis: Same as above Procedure: Laparoscopic appendectomy Surgeon: Dr. Harden Mo Estimated blood loss: Minimal Anesthesia: General Complications: None Drains: None Specimens: Appendix to pathology Special count was correct completion Disposition recovery stable condition  Indications: This a 25 year old healthy female who presents with 24 hours of right lower quadrant pain.  She has tenderness in her right lower quadrant and CT scan that shows acute appendicitis.  I discussed going to the operating room for laparoscopic appendectomy.  Procedure: After informed consent was obtained the patient was taken to the operating room.  She was already on antibiotics.  SCDs were in place.  She had voided prior to going to the operating room.  She was placed under general anesthesia without complication.  She was prepped and draped in the standard sterile surgical fashion.  Surgical timeout was then performed.  I filtrated Marcaine in the left upper quadrant.  I made a small incision.  I then inserted a 5 mm direct optical entry trocar into the abdomen without injury.  I then insufflated the abdomen to 15 mmHg pressure.  I then inserted a 5 mm trocar in the suprapubic position and the 12 mm infraumbilical trocar under direct vision.  She was noted to have acute appendicitis.  This was not perforated.  I was able to grasp the tip of the appendix.  I ended up taking some of the white line of Toldt down with the harmonic to roll this medially.  I then dissected the mesoappendix and divided it with the harmonic scalpel.  I took this back to the base which was viable and not inflamed.  I then used the GIA stapler to divide this with a small cuff of cecum associated with it.  This took 2 loads.  I then placed the appendix in a retrieval bag and removed it from the abdomen.  Hemostasis was observed.  I then remove the 12 mm trocar.  I closed this with  2-0 Vicryl sutures using the suture passer device.  I then remove the remaining trochars and desufflated the abdomen.  I then closed these with 4-0 Monocryl and glue.  She tolerated this well was extubated and transferred to recovery stable.

## 2020-05-03 NOTE — Discharge Summary (Signed)
[facility] Discharge Summary   Patient ID: Jodi York MRN: [id number] DOB/AGE: September 27, 1995 25 y.o.  Admit date: 05/02/2020 Discharge date: 05/04/2020  Admitting Diagnosis: Acute appendicitis   Discharge Diagnosis Patient Active Problem List   Diagnosis Date Noted  . Acute appendicitis 05/03/2020  . Premature rupture of membranes (PROM) affecting second pregnancy 03/14/2020    Consultants None  Imaging: CT ABDOMEN PELVIS W CONTRAST  Result Date: 05/02/2020 CLINICAL DATA:  Right lower quadrant abdominal pain. Radiates to umbilicus beginning this morning. EXAM: CT ABDOMEN AND PELVIS WITH CONTRAST TECHNIQUE: Multidetector CT imaging of the abdomen and pelvis was performed using the standard protocol following bolus administration of intravenous contrast. CONTRAST:  OMNIPAQUE IOHEXOL 300 MG/ML  SOLN COMPARISON:  Ultrasound abdomen 06/04/2016 FINDINGS: Lower chest: No acute abnormality. Hepatobiliary: No focal liver abnormality. The gallbladder is contracted. No gallstones, gallbladder wall thickening, or pericholecystic fluid. No biliary dilatation. Pancreas: No focal lesion. Normal pancreatic contour. No surrounding inflammatory changes. No main pancreatic ductal dilatation. Spleen: Normal in size without focal abnormality. Adrenals/Urinary Tract: No adrenal nodule bilaterally. Bilateral kidneys enhance symmetrically. No hydronephrosis. No hydroureter. The urinary bladder is unremarkable. Stomach/Bowel: Stomach is within normal limits. No evidence of bowel wall thickening or dilatation. The appendix is enlarged in caliber measuring up to 1.6 cm with associated appendiceal wall thickening and periappendiceal fat stranding. No appendiceal wall discontinuity identified. No definite appendicolith identified Vascular/Lymphatic: No abdominal aorta or iliac aneurysm. No abdominal, pelvic, or inguinal lymphadenopathy. Reproductive: Uterus and bilateral adnexa are unremarkable. Other: No  intraperitoneal free fluid. No intraperitoneal free gas. No organized fluid collection. Musculoskeletal: No abdominal wall hernia or abnormality. No suspicious lytic or blastic osseous lesions. No acute displaced fracture. IMPRESSION: Non-perforated acute appendicitis. No definite appendicolith identified. Electronically Signed   By: Tish Frederickson M.D.   On: 05/02/2020 23:44    Procedures Dr. Dwain Sarna (05/03/20) - Laparoscopic Appendectomy  Hospital Course:  The patient was admitted and underwent a laparoscopic appendectomy.  The patient tolerated the procedure well.  On POD 1, the patient was tolerating a regular diet, voiding well, mobilizing, and pain was controlled with oral pain medications.  The patient was stable for DC home at this time with appropriate follow up made.   Physical Exam: Abd:  Soft, ND, mild tenderness, incisions C/D/I  I or a member of my team have reviewed this patient in the Controlled Substance Database.   Allergies as of 05/04/2020      Reactions   Shellfish Allergy Anaphylaxis   Penicillins Nausea And Vomiting, Swelling   Has patient had a PCN reaction causing immediate rash, facial/tongue/throat swelling, SOB or lightheadedness with hypotension: Yes Has patient had a PCN reaction causing severe rash involving mucus membranes or skin necrosis: No Has patient had a PCN reaction that required hospitalization No Has patient had a PCN reaction occurring within the last 10 years: Yes If all of the above answers are "NO", then may proceed with Cephalosporin use. Has patient had a PCN reaction causing immediate rash, facial/tongue/throat swelling, SOB or lightheadedness with hypotension: Yes Has patient had a PCN reaction causing severe rash involving mucus membranes or skin necrosis: No Has patient had a PCN reaction that required hospitalization No Has patient had a PCN reaction occurring within the last 10 years: Yes If all of the above answers are "NO", then  may proceed with Cephalosporin use.   Pollen Extract Other (See Comments)   Eyes get puffy and face swells      Medication  List    TAKE these medications   acetaminophen 325 MG tablet Commonly known as: TYLENOL Take 2 tablets (650 mg total) by mouth every 6 (six) hours as needed for mild pain (or temp > 100).   ibuprofen 200 MG tablet Commonly known as: Motrin IB Take 3 tablets (600 mg total) by mouth every 8 (eight) hours as needed for mild pain.   prenatal multivitamin Tabs tablet Take 1 tablet by mouth daily at 12 noon.   traMADol 50 MG tablet Commonly known as: ULTRAM Take 1 tablet (50 mg total) by mouth every 6 (six) hours as needed (mild pain).         Follow-up Information    [facility], [facility]. Go on 05/25/2020.   Specialty: General Surgery Why: 9:45 AM. Please arrive 30 min prior to appointment time. Bring photo ID and insurance information.  Contact information: [address] [place] [address] [phone]               Signed: Letha Cape , [facility] 05/04/2020, 9:01 AM Please see Amion for pager number during day hours 7:00am-4:30pm

## 2020-05-03 NOTE — Anesthesia Procedure Notes (Signed)
Procedure Name: Intubation Date/Time: 05/03/2020 9:04 AM Performed by: Gwyndolyn Saxon, CRNA Pre-anesthesia Checklist: Patient identified, Emergency Drugs available, Suction available and Patient being monitored Patient Re-evaluated:Patient Re-evaluated prior to induction Oxygen Delivery Method: Circle system utilized Preoxygenation: Pre-oxygenation with 100% oxygen Induction Type: IV induction, Rapid sequence and Cricoid Pressure applied Laryngoscope Size: Mac and 3 Grade View: Grade I Tube type: Oral Tube size: 7.0 mm Number of attempts: 1 Airway Equipment and Method: Patient positioned with wedge pillow and Stylet Placement Confirmation: ETT inserted through vocal cords under direct vision,  positive ETCO2 and breath sounds checked- equal and bilateral Secured at: 21 cm Tube secured with: Tape Dental Injury: Teeth and Oropharynx as per pre-operative assessment

## 2020-05-03 NOTE — Transfer of Care (Signed)
Immediate Anesthesia Transfer of Care Note  Patient: Jodi York  Procedure(s) Performed: APPENDECTOMY LAPAROSCOPIC (N/A )  Patient Location: PACU  Anesthesia Type:General  Level of Consciousness: drowsy and patient cooperative  Airway & Oxygen Therapy: Patient Spontanous Breathing and Patient connected to face mask oxygen  Post-op Assessment: Report given to RN and Post -op Vital signs reviewed and stable  Post vital signs: Reviewed and stable  Last Vitals:  Vitals Value Taken Time  BP 129/72 05/03/20 1001  Temp    Pulse 69 05/03/20 1002  Resp    SpO2 100 % 05/03/20 1002  Vitals shown include unvalidated device data.  Last Pain:  Vitals:   05/03/20 0815  TempSrc:   PainSc: 4       Patients Stated Pain Goal: 2 (05/03/20 0815)  Complications: No complications documented.

## 2020-05-03 NOTE — Anesthesia Preprocedure Evaluation (Addendum)
Anesthesia Evaluation  Patient identified by MRN, date of birth, ID band Patient awake    Reviewed: Allergy & Precautions, NPO status , Patient's Chart, lab work & pertinent test results  History of Anesthesia Complications Negative for: history of anesthetic complications  Airway Mallampati: III  TM Distance: >3 FB Neck ROM: Full    Dental  (+) Teeth Intact   Pulmonary neg pulmonary ROS,    Pulmonary exam normal        Cardiovascular + Orthopnea  Normal cardiovascular exam  Had cardiomegaly and vascular congestion seen on CXR with clinical h/o orthopnea; evaluated by cardiology- echo normal   Neuro/Psych negative neurological ROS  negative psych ROS   GI/Hepatic Neg liver ROS, Acute appendicitis   Endo/Other  Morbid obesity  Renal/GU negative Renal ROS  negative genitourinary   Musculoskeletal negative musculoskeletal ROS (+)   Abdominal   Peds  Hematology  (+) anemia , Hgb 10.5   Anesthesia Other Findings  Echo 06/26/19: EF 55-60%, normal diastolic function, normal RV function, normal valves  Reproductive/Obstetrics                            Anesthesia Physical Anesthesia Plan  ASA: III  Anesthesia Plan: General   Post-op Pain Management:    Induction: Intravenous, Rapid sequence and Cricoid pressure planned  PONV Risk Score and Plan: 4 or greater and Ondansetron, Dexamethasone, Treatment may vary due to age or medical condition, Midazolam and Scopolamine patch - Pre-op  Airway Management Planned: Oral ETT  Additional Equipment: None  Intra-op Plan:   Post-operative Plan: Extubation in OR  Informed Consent: I have reviewed the patients History and Physical, chart, labs and discussed the procedure including the risks, benefits and alternatives for the proposed anesthesia with the patient or authorized representative who has indicated his/her understanding and acceptance.      Dental advisory given  Plan Discussed with:   Anesthesia Plan Comments:        Anesthesia Quick Evaluation

## 2020-05-04 ENCOUNTER — Encounter (HOSPITAL_COMMUNITY): Payer: Self-pay | Admitting: General Surgery

## 2020-05-04 LAB — URINE CULTURE

## 2020-05-04 LAB — SURGICAL PATHOLOGY

## 2020-05-04 MED ORDER — IBUPROFEN 200 MG PO TABS: 600.0000 mg | ORAL_TABLET | Freq: Three times a day (TID) | ORAL | 0 refills | Status: AC | PRN

## 2020-05-04 MED ORDER — TRAMADOL HCL 50 MG PO TABS
50.0000 mg | ORAL_TABLET | Freq: Four times a day (QID) | ORAL | 0 refills | Status: AC | PRN
Start: 2020-05-04 — End: ?

## 2020-05-04 NOTE — Discharge Instructions (Signed)
Laparoscopic Appendectomy, Adult, Care After This sheet gives you information about how to care for yourself after your procedure. Your doctor may also give you more specific instructions. If you have problems or questions, contact your doctor. What can I expect after the procedure? After the procedure, it is common to have:  Little energy for normal activities.  Mild pain in the area where the cuts from surgery (incisions) were made.  Trouble pooping (constipation). This can be caused by: ? Pain medicine. ? A lack of activity. Follow these instructions at home: Medicines  Take over-the-counter and prescription medicines only as told by your doctor.  If you were prescribed an antibiotic medicine, take it as told by your doctor. Do not stop taking it even if you start to feel better.  Do not drive or use heavy machinery while taking prescription pain medicine.  Ask your doctor if the medicine you are taking can cause trouble pooping. You may need to take steps to prevent or treat trouble pooping: ? Drink enough fluid to keep your pee (urine) pale yellow. ? Take over-the-counter or prescription medicines. ? Eat foods that are high in fiber. These include beans, whole grains, and fresh fruits and vegetables. ? Limit foods that are high in fat and sugar. These include fried or sweet foods. Incision care  Follow instructions from your doctor about how to take care of your cuts from surgery. Make sure you: ? Wash your hands with soap and water before and after you change your bandage (dressing). If you cannot use soap and water, use hand sanitizer. ? Change your bandage as told by your doctor. ? Leave stitches (sutures), skin glue, or skin tape (adhesive) strips in place. They may need to stay in place for 2 weeks or longer. If tape strips get loose and curl up, you may trim the loose edges. Do not remove tape strips completely unless your doctor says it is okay.  Check your cuts from  surgery every day for signs of infection. Check for: ? Redness, swelling, or pain. ? Fluid or blood. ? Warmth. ? Pus or a bad smell.   Bathing  Keep your cuts from surgery clean and dry. Clean them as told by your doctor. To do this: 1. Gently wash the cuts with soap and water. 2. Rinse the cuts with water to remove all soap. 3. Pat the cuts dry with a clean towel. Do not rub the cuts.  Do not take baths, swim, or use a hot tub for 2 weeks, or until your doctor says it is okay. You may take showers after 48 hours. Activity  Do not drive for 24 hours if you were given a medicine to help you relax (sedative) during your procedure.  Rest after the procedure. Return to your normal activities as told by your doctor. Ask your doctor what activities are safe for you.  For 3 weeks, or for as long as told by your doctor: ? Do not lift anything that is heavier than 10 lb (4.5 kg), or the limit that you are told. ? Do not play contact sports.   General instructions  If you were sent home with a drain, follow instructions from your doctor on how to care for it.  Take deep breaths. This helps to keep your lungs from getting an infection (pneumonia).  Keep all follow-up visits as told by your doctor. This is important. Contact a doctor if:  You have redness, swelling, or pain around a cut  from surgery.  You have fluid or blood coming from a cut.  Your cut feels warm to the touch.  You have pus or a bad smell coming from a cut or a bandage.  The edges of a cut break open after the stitches have been taken out.  You have pain in your shoulders that gets worse.  You feel dizzy or you pass out (faint).  You have shortness of breath.  You keep feeling sick to your stomach (nauseous).  You keep throwing up (vomiting).  You get watery poop (diarrhea) or you cannot control your poop.  You lose your appetite.  You have swelling or pain in your legs.  You get a rash. Get help right  away if:  You have a fever.  You have trouble breathing.  You have sharp pains in your chest. Summary  After the procedure, it is common to have low energy, mild pain, and trouble pooping.  Infection is a common problem after this procedure. Follow your doctor's instructions about caring for yourself after the procedure.  Rest after the procedure. Return to your normal activities as told by your doctor.  Contact your doctor if you see signs of infection around your cuts from surgery, or you get short of breath. Get help right away if you have a fever, chest pain, or trouble breathing. This information is not intended to replace advice given to you by your health care provider. Make sure you discuss any questions you have with your health care provider. Document Revised: 08/02/2017 Document Reviewed: 08/02/2017 Elsevier Patient Education  2021 [employer]. [facility] LAPAROSCOPIC SURGERY: POST OP INSTRUCTIONS Always review your discharge instruction sheet given to you by the facility where your surgery was performed. IF YOU HAVE DISABILITY OR FAMILY LEAVE FORMS, YOU MUST BRING THEM TO THE OFFICE FOR PROCESSING.   DO NOT GIVE THEM TO YOUR DOCTOR.  PAIN CONTROL  1. First take acetaminophen (Tylenol) AND/or ibuprofen (Advil) to control your pain after surgery.  Follow directions on package.  Taking acetaminophen (Tylenol) and/or ibuprofen (Advil) regularly after surgery will help to control your pain and lower the amount of prescription pain medication you may need.  You should not take more than 3,000 mg (3 grams) of acetaminophen (Tylenol) in 24 hours.  You should not take ibuprofen (Advil), aleve, motrin, naprosyn or other NSAIDS if you have a history of stomach ulcers or chronic kidney disease.  2. A prescription for pain medication may be given to you upon discharge.  Take your pain medication as prescribed, if you still have uncontrolled pain after taking  acetaminophen (Tylenol) or ibuprofen (Advil). 3. Use ice packs to help control pain. 4. If you need a refill on your pain medication, please contact your pharmacy.  They will contact our office to request authorization. Prescriptions will not be filled after 5pm or on week-ends.  HOME MEDICATIONS 5. Take your usually prescribed medications unless otherwise directed.  DIET 6. You should follow a light diet the first few days after arrival home.  Be sure to include lots of fluids daily. Avoid fatty, fried foods.   CONSTIPATION 7. It is common to experience some constipation after surgery and if you are taking pain medication.  Increasing fluid intake and taking a stool softener (such as Colace) will usually help or prevent this problem from occurring.  A mild laxative (Milk of Magnesia or Miralax) should be taken according to package instructions if there are no bowel movements after  48 hours.  WOUND/INCISION CARE 8. Most patients will experience some swelling and bruising in the area of the incisions.  Ice packs will help.  Swelling and bruising can take several days to resolve.  9. Unless discharge instructions indicate otherwise, follow guidelines below  a. STERI-STRIPS - you may remove your outer bandages 48 hours after surgery, and you may shower at that time.  You have steri-strips (small skin tapes) in place directly over the incision.  These strips should be left on the skin for 7-10 days.   b. DERMABOND/SKIN GLUE - you may shower in 24 hours.  The glue will flake off over the next 2-3 weeks. 10. Any sutures or staples will be removed at the office during your follow-up visit.  ACTIVITIES 11. You may resume regular (light) daily activities beginning the next day--such as daily self-care, walking, climbing stairs--gradually increasing activities as tolerated.  You may have sexual intercourse when it is comfortable.  Refrain from any heavy lifting or straining until approved by your  doctor. a. You may drive when you are no longer taking prescription pain medication, you can comfortably wear a seatbelt, and you can safely maneuver your car and apply brakes.  FOLLOW-UP 12. You should see your doctor in the office for a follow-up appointment approximately 2-3 weeks after your surgery.  You should have been given your post-op/follow-up appointment when your surgery was scheduled.  If you did not receive a post-op/follow-up appointment, make sure that you call for this appointment within a day or two after you arrive home to insure a convenient appointment time.   WHEN TO CALL YOUR DOCTOR: 1. Fever over 101.0 2. Inability to urinate 3. Continued bleeding from incision. 4. Increased pain, redness, or drainage from the incision. 5. Increasing abdominal pain  The clinic staff is available to answer your questions during regular business hours.  Please don't hesitate to call and ask to speak to one of the nurses for clinical concerns.  If you have a medical emergency, go to the nearest emergency room or call 911.  A surgeon from [facility] is always on call at the hospital. [address], [address], [place], [place]  [address] ? [address] [address], [place], [place]   [address] [phone] ? [phone] ? FAX [phone] Web site: [email or web address]  ........Marland Kitchen   Managing Your Pain After Surgery Without Opioids    Thank you for participating in our program to help patients manage their pain after surgery without opioids. This is part of our effort to provide you with the best care possible, without exposing you or your family to the risk that opioids pose.  What pain can I expect after surgery? You can expect to have some pain after surgery. This is normal. The pain is typically worse the day after surgery, and quickly begins to get better. Many studies have found that many patients are able to manage their pain after surgery with  Over-the-Counter (OTC) medications such as Tylenol and Motrin. If you have a condition that does not allow you to take Tylenol or Motrin, notify your surgical team.  How will I manage my pain? The best strategy for controlling your pain after surgery is around the clock pain control with Tylenol (acetaminophen) and Motrin (ibuprofen or Advil). Alternating these medications with each other allows you to maximize your pain control. In addition to Tylenol and Motrin, you can use heating pads or ice packs on your incisions to help reduce your pain.  How will I alternate your regular strength over-the-counter pain medication? You will take a dose of pain medication every three hours. ; Start by taking 650 mg of Tylenol (2 pills of 325 mg) ; 3 hours later take 600 mg of Motrin (3 pills of 200 mg) ; 3 hours after taking the Motrin take 650 mg of Tylenol ; 3 hours after that take 600 mg of Motrin.   - 1 -  See example - if your first dose of Tylenol is at 12:00 PM   12:00 PM Tylenol 650 mg (2 pills of 325 mg)  3:00 PM Motrin 600 mg (3 pills of 200 mg)  6:00 PM Tylenol 650 mg (2 pills of 325 mg)  9:00 PM Motrin 600 mg (3 pills of 200 mg)  Continue alternating every 3 hours   We recommend that you follow this schedule around-the-clock for at least 3 days after surgery, or until you feel that it is no longer needed. Use the table on the last page of this handout to keep track of the medications you are taking. Important: Do not take more than 3000mg  of Tylenol or 3200mg  of Motrin in a 24-hour period. Do not take ibuprofen/Motrin if you have a history of bleeding stomach ulcers, severe kidney disease, &/or actively taking a blood thinner  What if I still have pain? If you have pain that is not controlled with the over-the-counter pain medications (Tylenol and Motrin or Advil) you might have what we call "breakthrough" pain. You will receive a prescription for a small amount of an opioid pain  medication such as Oxycodone, Tramadol, or Tylenol with Codeine. Use these opioid pills in the first 24 hours after surgery if you have breakthrough pain. Do not take more than 1 pill every 4-6 hours.  If you still have uncontrolled pain after using all opioid pills, don't hesitate to call our staff using the number provided. We will help make sure you are managing your pain in the best way possible, and if necessary, we can provide a prescription for additional pain medication.   Day 1    Time  Name of Medication Number of pills taken  Amount of Acetaminophen  Pain Level   Comments  AM PM       AM PM       AM PM       AM PM       AM PM       AM PM       AM PM       AM PM       Total Daily amount of Acetaminophen Do not take more than  3,000 mg per day      Day 2    Time  Name of Medication Number of pills taken  Amount of Acetaminophen  Pain Level   Comments  AM PM       AM PM       AM PM       AM PM       AM PM       AM PM       AM PM       AM PM       Total Daily amount of Acetaminophen Do not take more than  3,000 mg per day      Day 3    Time  Name of Medication Number of pills taken  Amount of Acetaminophen  Pain Level   Comments  AM PM       AM PM       AM PM       AM PM          AM PM       AM PM       AM PM       AM PM       Total Daily amount of Acetaminophen Do not take more than  3,000 mg per day      Day 4    Time  Name of Medication Number of pills taken  Amount of Acetaminophen  Pain Level   Comments  AM PM       AM PM       AM PM       AM PM       AM PM       AM PM       AM PM       AM PM       Total Daily amount of Acetaminophen Do not take more than  3,000 mg per day      Day 5    Time  Name of Medication Number of pills taken  Amount of Acetaminophen  Pain Level   Comments  AM PM       AM PM       AM PM       AM PM       AM PM       AM PM       AM PM       AM PM       Total Daily amount of  Acetaminophen Do not take more than  3,000 mg per day       Day 6    Time  Name of Medication Number of pills taken  Amount of Acetaminophen  Pain Level  Comments  AM PM       AM PM       AM PM       AM PM       AM PM       AM PM       AM PM       AM PM       Total Daily amount of Acetaminophen Do not take more than  3,000 mg per day      Day 7    Time  Name of Medication Number of pills taken  Amount of Acetaminophen  Pain Level   Comments  AM PM       AM PM       AM PM       AM PM       AM PM       AM PM       AM PM       AM PM       Total Daily amount of Acetaminophen Do not take more than  3,000 mg per day        For additional information about how and where to safely dispose of unused opioid medications - [email or web address]  Disclaimer: This document contains information and/or instructional materials adapted from [facility] for the typical patient with your condition. It does not replace medical advice from your health care provider because your experience may differ from that of the typical patient. Talk to your health care provider if you have any questions about this document, your condition  or your treatment plan. Adapted from [facility]

## 2020-05-08 LAB — CULTURE, BLOOD (ROUTINE X 2)
Culture: NO GROWTH
Culture: NO GROWTH
Special Requests: ADEQUATE

## 2022-08-20 IMAGING — CT CT ABD-PELV W/ CM
2 of 4 series · 16 of 46 positions shown, 18 images · IV contrast (omnipaque)
Comparison: Ultrasound abdomen 06/04/2016

CLINICAL DATA: Right lower quadrant abdominal pain. Radiates to
umbilicus beginning this morning.

EXAM:
CT ABDOMEN AND PELVIS WITH CONTRAST
TECHNIQUE: Multidetector CT imaging of the abdomen and pelvis was performed
using the standard protocol following bolus administration of
intravenous contrast.
CONTRAST:  100mL OMNIPAQUE IOHEXOL 300 MG/ML  SOLN

[Series 2: axial st · axial · 0.81mm/px · z∈[-472,-52]mm · 13 of 96 slices shown, 15 images]
[im 6/96  soft-tissue]
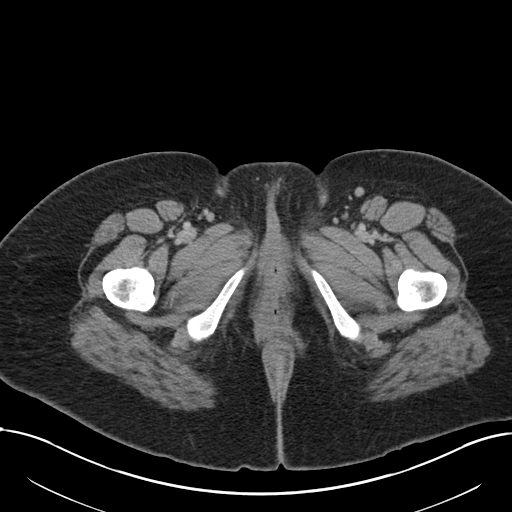
[im 6/96  bone]
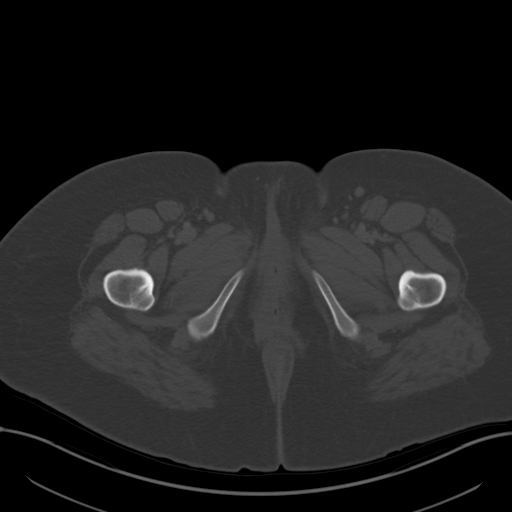
[im 12/96  soft-tissue]
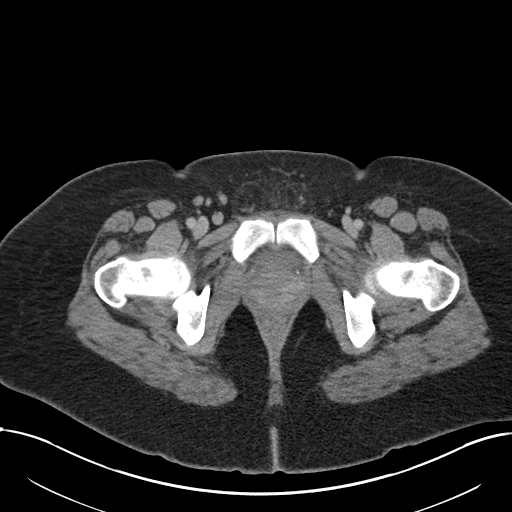
[im 23/96  soft-tissue]
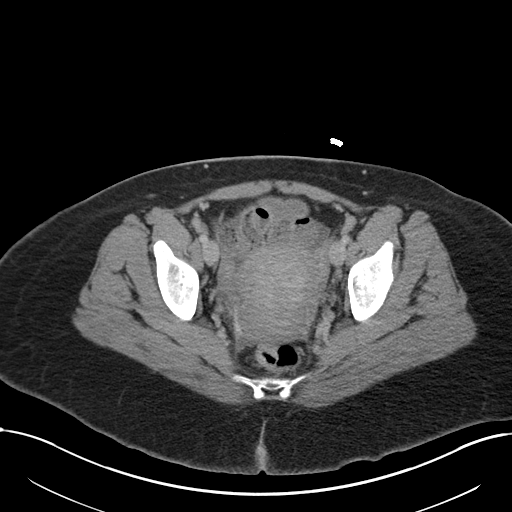
[im 28/96  soft-tissue]
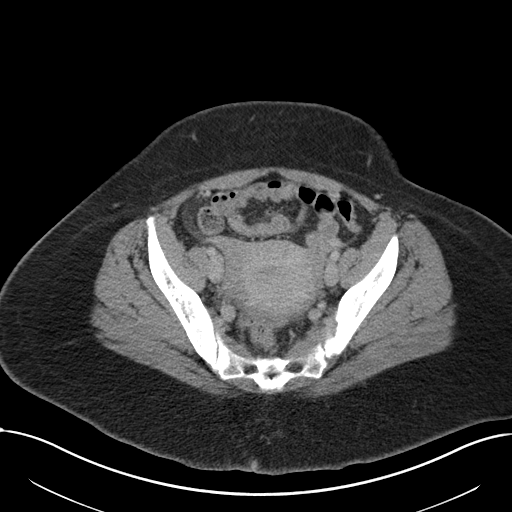
[im 34/96  soft-tissue]
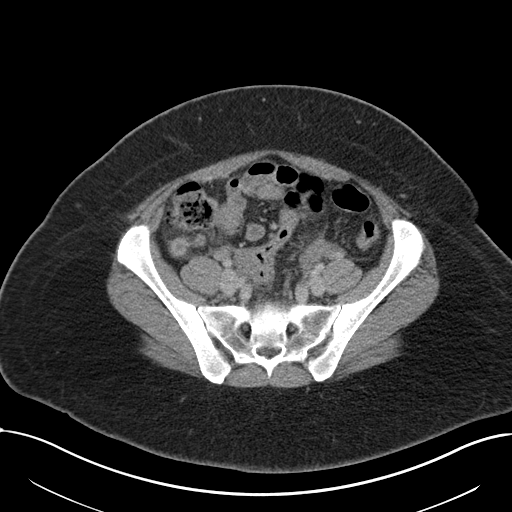
[im 40/96  soft-tissue]
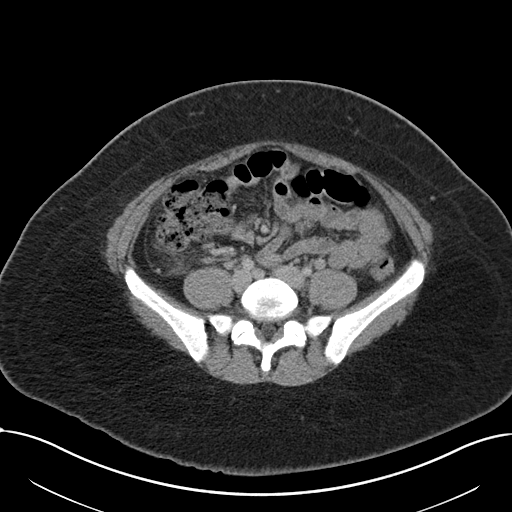
[im 51/96  soft-tissue]
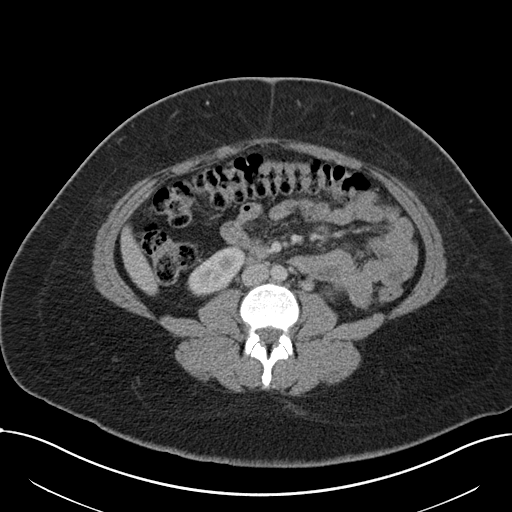
[im 56/96  soft-tissue]
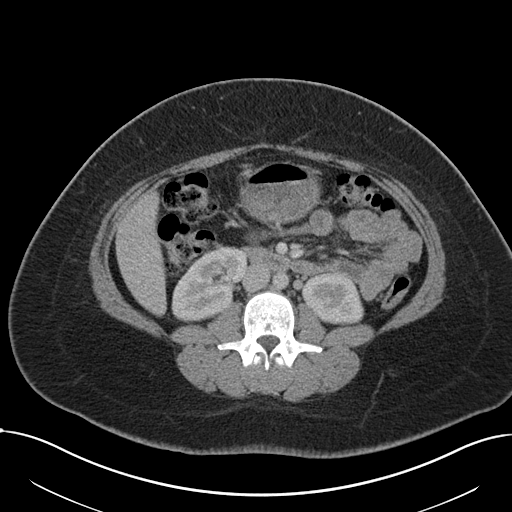
[im 62/96  soft-tissue]
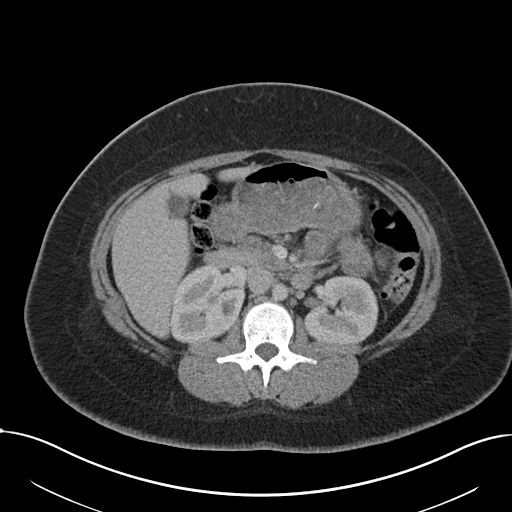
[im 62/96  bone]
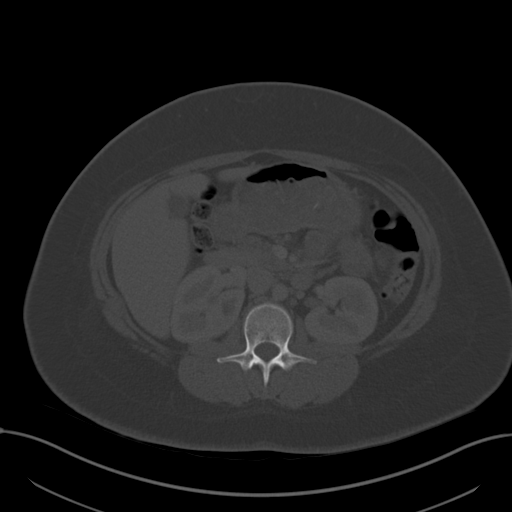
[im 68/96  soft-tissue]
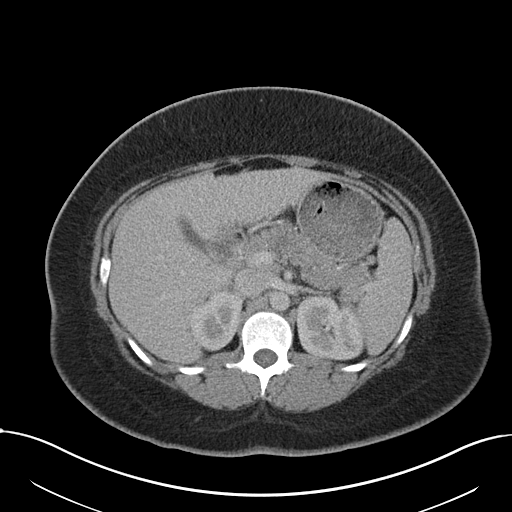
[im 73/96  soft-tissue]
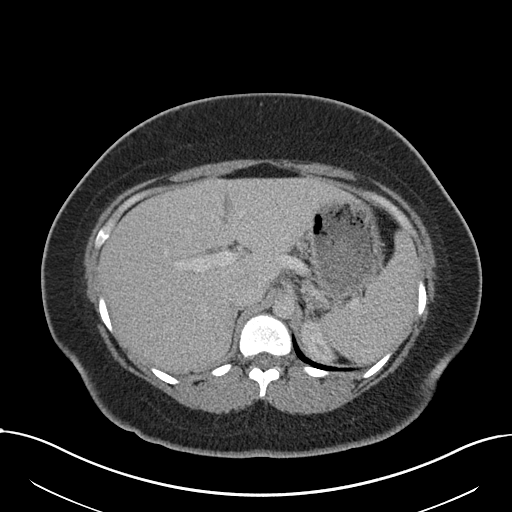
[im 84/96  soft-tissue]
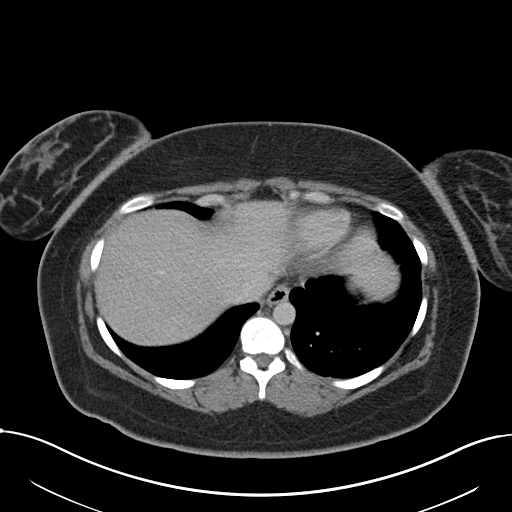
[im 90/96  soft-tissue]
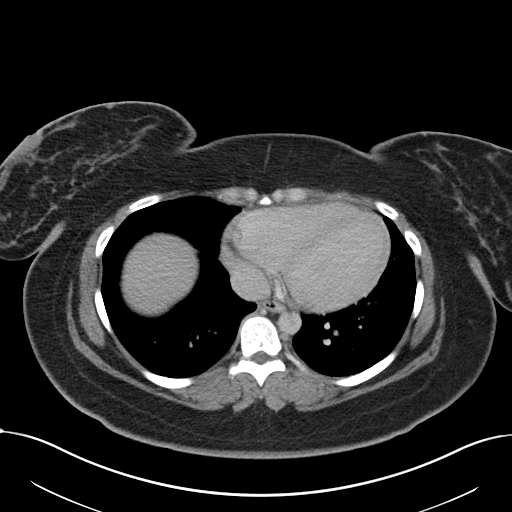

[Series 5: coronal st · coronal · 0.94mm/px · 3 of 168 slices shown]
[im 56/168  soft-tissue]
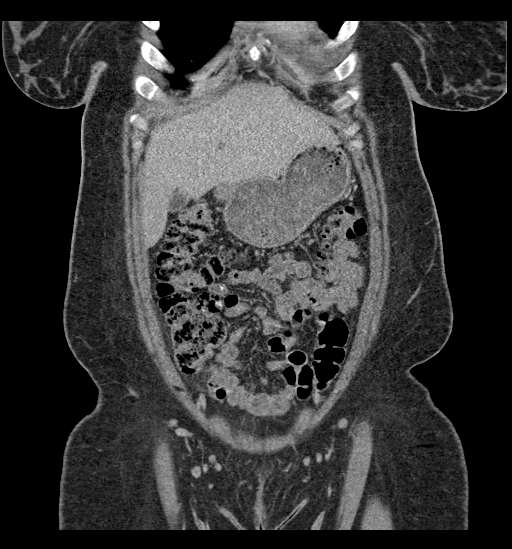
[im 75/168  soft-tissue]
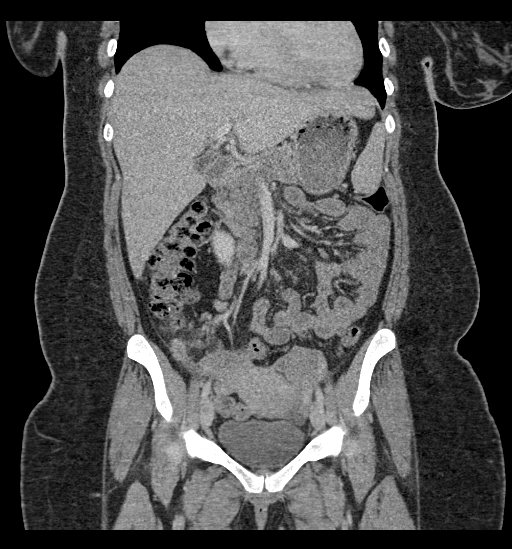
[im 93/168  soft-tissue]
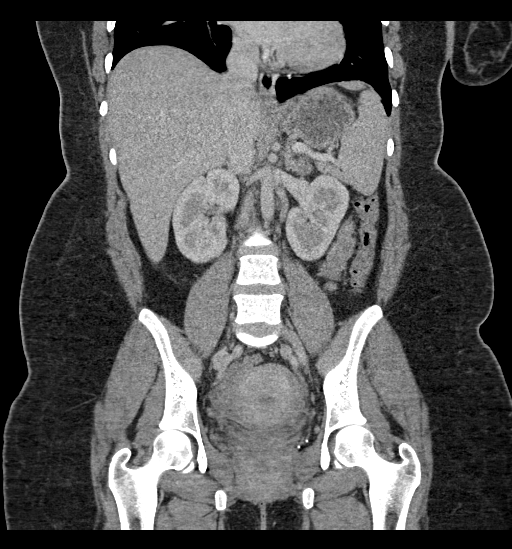

[16 of 46 positions shown; findings below may reference images not displayed]

FINDINGS: Lower chest: No acute abnormality.

Hepatobiliary: No focal liver abnormality. The gallbladder is
contracted. No gallstones, gallbladder wall thickening, or
pericholecystic fluid. No biliary dilatation.

Pancreas: No focal lesion. Normal pancreatic contour. No surrounding
inflammatory changes. No main pancreatic ductal dilatation.

Spleen: Normal in size without focal abnormality.

Adrenals/Urinary Tract: No adrenal nodule bilaterally. Bilateral
kidneys enhance symmetrically. No hydronephrosis. No hydroureter.
The urinary bladder is unremarkable.

Stomach/Bowel: Stomach is within normal limits. No evidence of bowel
wall thickening or dilatation. The appendix is enlarged in caliber
measuring up to 1.6 cm with associated appendiceal wall thickening
and periappendiceal fat stranding. No appendiceal wall discontinuity
identified. No definite appendicolith identified

Vascular/Lymphatic: No abdominal aorta or iliac aneurysm. No
abdominal, pelvic, or inguinal lymphadenopathy.

Reproductive: Uterus and bilateral adnexa are unremarkable.

Other: No intraperitoneal free fluid. No intraperitoneal free gas.
No organized fluid collection.

Musculoskeletal:

No abdominal wall hernia or abnormality.

No suspicious lytic or blastic osseous lesions. No acute displaced
fracture.
IMPRESSION: Non-perforated acute appendicitis. No definite appendicolith
identified.
